# Patient Record
Sex: Female | Born: 1960 | Race: White | Hispanic: No | Smoking: Never smoker
Health system: Southern US, Community
[De-identification: ages and names within clinical notes are randomized; demographics above are authoritative.]

## PROBLEM LIST (undated history)

## (undated) DIAGNOSIS — E669 Obesity, unspecified: Secondary | ICD-10-CM

## (undated) DIAGNOSIS — K760 Fatty (change of) liver, not elsewhere classified: Secondary | ICD-10-CM

## (undated) DIAGNOSIS — R011 Cardiac murmur, unspecified: Secondary | ICD-10-CM

## (undated) DIAGNOSIS — K219 Gastro-esophageal reflux disease without esophagitis: Secondary | ICD-10-CM

## (undated) DIAGNOSIS — M1990 Unspecified osteoarthritis, unspecified site: Secondary | ICD-10-CM

## (undated) DIAGNOSIS — IMO0002 Reserved for concepts with insufficient information to code with codable children: Secondary | ICD-10-CM

## (undated) DIAGNOSIS — T7840XA Allergy, unspecified, initial encounter: Secondary | ICD-10-CM

## (undated) HISTORY — PX: CHOLECYSTECTOMY: SHX55

## (undated) HISTORY — DX: Gastro-esophageal reflux disease without esophagitis: K21.9

## (undated) HISTORY — DX: Fatty (change of) liver, not elsewhere classified: K76.0

## (undated) HISTORY — DX: Reserved for concepts with insufficient information to code with codable children: IMO0002

## (undated) HISTORY — PX: LUMBAR SPINE SURGERY: SHX701

## (undated) HISTORY — DX: Unspecified osteoarthritis, unspecified site: M19.90

## (undated) HISTORY — DX: Cardiac murmur, unspecified: R01.1

## (undated) HISTORY — PX: SPINE SURGERY: SHX786

## (undated) HISTORY — DX: Allergy, unspecified, initial encounter: T78.40XA

## (undated) HISTORY — DX: Obesity, unspecified: E66.9

---

## 1999-02-12 ENCOUNTER — Other Ambulatory Visit: Admission: RE | Admit: 1999-02-12 | Discharge: 1999-02-12 | Payer: Self-pay | Admitting: Gynecology

## 2000-06-23 ENCOUNTER — Other Ambulatory Visit: Admission: RE | Admit: 2000-06-23 | Discharge: 2000-06-23 | Payer: Self-pay | Admitting: Gynecology

## 2001-05-28 ENCOUNTER — Encounter: Payer: Self-pay | Admitting: Specialist

## 2001-05-28 ENCOUNTER — Encounter: Admission: RE | Admit: 2001-05-28 | Discharge: 2001-05-28 | Payer: Self-pay | Admitting: Specialist

## 2001-06-22 ENCOUNTER — Observation Stay (HOSPITAL_COMMUNITY): Admission: RE | Admit: 2001-06-22 | Discharge: 2001-06-23 | Payer: Self-pay | Admitting: Specialist

## 2001-06-22 ENCOUNTER — Encounter (INDEPENDENT_AMBULATORY_CARE_PROVIDER_SITE_OTHER): Payer: Self-pay

## 2001-06-22 ENCOUNTER — Encounter: Payer: Self-pay | Admitting: Specialist

## 2001-08-03 ENCOUNTER — Other Ambulatory Visit: Admission: RE | Admit: 2001-08-03 | Discharge: 2001-08-03 | Payer: Self-pay | Admitting: Internal Medicine

## 2002-08-30 ENCOUNTER — Other Ambulatory Visit: Admission: RE | Admit: 2002-08-30 | Discharge: 2002-08-30 | Payer: Self-pay | Admitting: Internal Medicine

## 2003-05-24 ENCOUNTER — Encounter: Payer: Self-pay | Admitting: Internal Medicine

## 2003-05-24 ENCOUNTER — Encounter: Admission: RE | Admit: 2003-05-24 | Discharge: 2003-05-24 | Payer: Self-pay | Admitting: Internal Medicine

## 2004-08-07 ENCOUNTER — Ambulatory Visit: Payer: Self-pay | Admitting: Internal Medicine

## 2004-08-19 ENCOUNTER — Other Ambulatory Visit: Admission: RE | Admit: 2004-08-19 | Discharge: 2004-08-19 | Payer: Self-pay | Admitting: Internal Medicine

## 2004-08-19 ENCOUNTER — Ambulatory Visit: Payer: Self-pay | Admitting: Internal Medicine

## 2005-01-01 ENCOUNTER — Ambulatory Visit: Payer: Self-pay | Admitting: Family Medicine

## 2005-08-29 ENCOUNTER — Ambulatory Visit: Payer: Self-pay | Admitting: Family Medicine

## 2005-09-08 ENCOUNTER — Encounter: Payer: Self-pay | Admitting: Family Medicine

## 2005-09-08 LAB — CONVERTED CEMR LAB

## 2006-01-01 ENCOUNTER — Other Ambulatory Visit: Admission: RE | Admit: 2006-01-01 | Discharge: 2006-01-01 | Payer: Self-pay | Admitting: Gynecology

## 2006-02-12 ENCOUNTER — Ambulatory Visit: Payer: Self-pay | Admitting: Family Medicine

## 2006-02-17 ENCOUNTER — Ambulatory Visit: Payer: Self-pay | Admitting: Family Medicine

## 2006-05-04 ENCOUNTER — Encounter: Admission: RE | Admit: 2006-05-04 | Discharge: 2006-05-04 | Payer: Self-pay | Admitting: Gynecology

## 2006-09-04 ENCOUNTER — Ambulatory Visit: Payer: Self-pay | Admitting: Family Medicine

## 2006-09-07 ENCOUNTER — Ambulatory Visit: Payer: Self-pay | Admitting: Family Medicine

## 2007-02-07 LAB — CONVERTED CEMR LAB: Pap Smear: NORMAL

## 2007-04-23 ENCOUNTER — Other Ambulatory Visit: Admission: RE | Admit: 2007-04-23 | Discharge: 2007-04-23 | Payer: Self-pay | Admitting: Gynecology

## 2007-09-14 ENCOUNTER — Encounter: Payer: Self-pay | Admitting: Family Medicine

## 2007-09-14 DIAGNOSIS — E785 Hyperlipidemia, unspecified: Secondary | ICD-10-CM

## 2007-09-14 DIAGNOSIS — IMO0002 Reserved for concepts with insufficient information to code with codable children: Secondary | ICD-10-CM

## 2007-09-14 DIAGNOSIS — M1990 Unspecified osteoarthritis, unspecified site: Secondary | ICD-10-CM | POA: Insufficient documentation

## 2007-09-14 DIAGNOSIS — J45909 Unspecified asthma, uncomplicated: Secondary | ICD-10-CM | POA: Insufficient documentation

## 2007-09-14 DIAGNOSIS — K219 Gastro-esophageal reflux disease without esophagitis: Secondary | ICD-10-CM

## 2007-09-15 ENCOUNTER — Ambulatory Visit: Payer: Self-pay | Admitting: Family Medicine

## 2007-09-16 LAB — CONVERTED CEMR LAB
ALT: 14 units/L (ref 0–35)
Alkaline Phosphatase: 59 units/L (ref 39–117)
BUN: 7 mg/dL (ref 6–23)
Basophils Absolute: 0 10*3/uL (ref 0.0–0.1)
Basophils Relative: 0.4 % (ref 0.0–1.0)
Bilirubin, Direct: 0.1 mg/dL (ref 0.0–0.3)
CO2: 28 meq/L (ref 19–32)
Calcium: 8.7 mg/dL (ref 8.4–10.5)
Cholesterol: 217 mg/dL (ref 0–200)
Eosinophils Absolute: 0.1 10*3/uL (ref 0.0–0.6)
GFR calc Af Amer: 87 mL/min
GFR calc non Af Amer: 72 mL/min
HDL: 42.8 mg/dL (ref >39.0)
Hemoglobin: 13.6 g/dL (ref 12.0–15.0)
Lymphocytes Relative: 30.6 % (ref 12.0–46.0)
MCHC: 34.8 g/dL (ref 30.0–36.0)
MCV: 92.5 fL (ref 78.0–100.0)
Monocytes Absolute: 0.3 10*3/uL (ref 0.2–0.7)
Monocytes Relative: 6 % (ref 3.0–11.0)
Neutro Abs: 3.6 10*3/uL (ref 1.4–7.7)
Platelets: 306 10*3/uL (ref 150–400)
Potassium: 3.8 meq/L (ref 3.5–5.1)
TSH: 1.32 microintl units/mL (ref 0.35–5.50)
Total Protein: 6.8 g/dL (ref 6.0–8.3)
VLDL: 45 mg/dL — ABNORMAL HIGH (ref 0–40)

## 2008-03-18 ENCOUNTER — Encounter: Payer: Self-pay | Admitting: Family Medicine

## 2008-03-18 ENCOUNTER — Observation Stay (HOSPITAL_COMMUNITY): Admission: EM | Admit: 2008-03-18 | Discharge: 2008-03-19 | Payer: Self-pay | Admitting: Emergency Medicine

## 2008-03-18 ENCOUNTER — Encounter (INDEPENDENT_AMBULATORY_CARE_PROVIDER_SITE_OTHER): Payer: Self-pay | Admitting: Surgery

## 2008-04-08 LAB — CONVERTED CEMR LAB: Pap Smear: NORMAL

## 2008-04-11 ENCOUNTER — Encounter: Admission: RE | Admit: 2008-04-11 | Discharge: 2008-04-11 | Payer: Self-pay | Admitting: Gynecology

## 2008-06-02 ENCOUNTER — Ambulatory Visit: Payer: Self-pay | Admitting: Women's Health

## 2008-06-02 ENCOUNTER — Encounter: Payer: Self-pay | Admitting: Women's Health

## 2008-06-02 ENCOUNTER — Other Ambulatory Visit: Admission: RE | Admit: 2008-06-02 | Discharge: 2008-06-02 | Payer: Self-pay | Admitting: Gynecology

## 2008-09-15 ENCOUNTER — Ambulatory Visit: Payer: Self-pay | Admitting: Family Medicine

## 2008-09-19 LAB — CONVERTED CEMR LAB
Albumin: 3.8 g/dL (ref 3.5–5.2)
BUN: 7 mg/dL (ref 6–23)
Basophils Absolute: 0.1 10*3/uL (ref 0.0–0.1)
Basophils Relative: 0.9 % (ref 0.0–3.0)
Calcium: 8.9 mg/dL (ref 8.4–10.5)
Cholesterol: 198 mg/dL (ref 0–200)
Creatinine, Ser: 0.7 mg/dL (ref 0.4–1.2)
Eosinophils Absolute: 0.2 10*3/uL (ref 0.0–0.7)
Eosinophils Relative: 2.1 % (ref 0.0–5.0)
GFR calc Af Amer: 116 mL/min
GFR calc non Af Amer: 96 mL/min
HCT: 38.4 % (ref 36.0–46.0)
HDL: 47.3 mg/dL (ref >39.0)
MCHC: 34.8 g/dL (ref 30.0–36.0)
MCV: 94.9 fL (ref 78.0–100.0)
Monocytes Absolute: 0.5 10*3/uL (ref 0.1–1.0)
RBC: 4.05 M/uL (ref 3.87–5.11)
TSH: 0.97 microintl units/mL (ref 0.35–5.50)
WBC: 9.4 10*3/uL (ref 4.5–10.5)

## 2009-06-06 ENCOUNTER — Encounter: Admission: RE | Admit: 2009-06-06 | Discharge: 2009-06-06 | Payer: Self-pay | Admitting: Gynecology

## 2009-09-11 ENCOUNTER — Ambulatory Visit: Payer: Self-pay | Admitting: Family Medicine

## 2009-09-11 LAB — CONVERTED CEMR LAB
ALT: 14 units/L (ref 0–35)
Alkaline Phosphatase: 50 units/L (ref 39–117)
Basophils Relative: 0.5 % (ref 0.0–3.0)
Bilirubin, Direct: 0.1 mg/dL (ref 0.0–0.3)
Chloride: 103 meq/L (ref 96–112)
Cholesterol: 208 mg/dL — ABNORMAL HIGH (ref 0–200)
Creatinine, Ser: 0.9 mg/dL (ref 0.4–1.2)
Direct LDL: 130.8 mg/dL
Eosinophils Relative: 1.2 % (ref 0.0–5.0)
HCT: 41.1 % (ref 36.0–46.0)
Hemoglobin: 13.7 g/dL (ref 12.0–15.0)
MCV: 96.1 fL (ref 78.0–100.0)
Monocytes Absolute: 0.3 10*3/uL (ref 0.1–1.0)
Neutrophils Relative %: 64.4 % (ref 43.0–77.0)
Potassium: 4.2 meq/L (ref 3.5–5.1)
RBC: 4.27 M/uL (ref 3.87–5.11)
Sodium: 138 meq/L (ref 135–145)
Total CHOL/HDL Ratio: 5
Total Protein: 7 g/dL (ref 6.0–8.3)
WBC: 6.1 10*3/uL (ref 4.5–10.5)

## 2009-09-17 ENCOUNTER — Ambulatory Visit: Payer: Self-pay | Admitting: Family Medicine

## 2010-05-09 LAB — HM MAMMOGRAPHY: HM Mammogram: NORMAL

## 2010-07-10 ENCOUNTER — Encounter: Admission: RE | Admit: 2010-07-10 | Discharge: 2010-07-10 | Payer: Self-pay | Admitting: Gynecology

## 2010-10-08 NOTE — Assessment & Plan Note (Signed)
Summary: CPX  CYD   Vital Signs:  Patient profile:   50 year old female Height:      58.5 inches Weight:      241 pounds BMI:     49.69 Temp:     98.5 degrees F oral Pulse rate:   72 / minute Pulse rhythm:   regular BP sitting:   112 / 80  (left arm) Cuff size:   large  Vitals Entered By: Lowella Petties CMA (September 17, 2009 2:17 PM) CC: 30 minute check up   History of Present Illness: here for health mt exam  has been feeling ok - kind of tired working some long hours   wt is down 5 lb this visit-- has been really working on it  by her scales -- lost 6 lb has cut back on fat and portions in general - since her gallbladder surgery   recent labs all nl wellness panel but chol is up a bit  trig 186/ HDl 44 and LDL 130.8 (up from 114 last time)    liver tests nl (hx of fatty liver)  some walking for exercise  limited in what she can do with her back  is wearing toning shoes   pap 8/09 nl -- has f/u upcoming with gyn  period has been irregular -- closer to menopause has always been heavy and painful   mam 8/09 nl --then had one in 2010--thinks around sept -- wasnl  self exam no lumps or changes   Td up to date 2010 ptx was 07 flu shot -- did not get this year   nexium -- has worked well in general - occas breakthrough symptoms / not often at all  is wanting to try it just as needed --  also better with low fat diet   Allergies: 1)  Codeine  Past History:  Past Surgical History: Last updated: 03/28/2008 Lumbar spine surgery- ruptured disk 09 [facility]  Family History: Last updated: 09/15/2008 Father: HTN, MI x 3 Mother: HTN,  chol, died from kidney disease, thyroid probs, female cancer?    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 Eyes:  Denies blurring and eye pain. ENT:  Denies sore throat. CV:  Denies chest pain or discomfort, palpitations, shortness of breath with exertion, and swelling of feet. Resp:  Denies cough and wheezing. GI:  Denies abdominal pain, bloody stools, change in bowel habits, indigestion, and nausea. GU:  Complains of abnormal vaginal bleeding; denies discharge, dysuria, and urinary frequency. MS:  Complains of low back pain and stiffness; denies joint pain and muscle weakness. Derm:  Denies changes in color of skin, poor wound healing, and rash. Neuro:  Denies numbness and tingling. Psych:  Denies anxiety and depression. Endo:  Denies cold intolerance, excessive thirst, excessive urination, and heat intolerance. Heme:  Denies abnormal bruising and bleeding.  Physical Exam  General:  morbidly obese and well appearing Head:  normocephalic, atraumatic, and no abnormalities observed.   Eyes:  vision grossly intact, pupils equal, pupils round, and pupils reactive to light.  no conjunctival pallor, injection or icterus  Ears:  R ear normal and L ear normal.  - scant cerumen Nose:  no nasal discharge.  Mouth:  pharynx pink and moist.   Neck:  supple with full rom and no masses or thyromegally, no JVD or carotid bruit  Lungs:  Normal respiratory effort, chest expands symmetrically. Lungs are clear to auscultation, no crackles or wheezes. Heart:  Normal rate and regular rhythm. S1 and S2 normal without gallop, murmur, click, rub or other extra sounds. Abdomen:  Bowel sounds positive,abdomen soft and non-tender without masses, organomegaly or hernias noted. no renal bruits  new laparoscopy scars are well healed Msk:  No deformity or scoliosis noted of thoracic or lumbar spine.  poor rom LS - baseline  no acute joint changes  Pulses:  R and L carotid,radial,femoral,dorsalis pedis and  posterior tibial pulses are full and equal bilaterally Extremities:  No clubbing, cyanosis, edema, or deformity noted with normal full range of motion of all joints.   Neurologic:  sensation intact to light touch, gait normal, and DTRs symmetrical and normal.   Skin:  Intact without suspicious lesions or rashes fair with some lentigos  Cervical Nodes:  No lymphadenopathy noted Inguinal Nodes:  No significant adenopathy Psych:  normal affect, talkative and pleasant    Impression & Recommendations:  Problem # 1:  HEALTH MAINTENANCE EXAM (ICD-V70.0) Assessment Comment Only reviewed health habits including diet, exercise and skin cancer prevention reviewed health maintenance list and family history reviewed labs today in detail disc imp of wt loss to general health  Problem # 2:  HYPERCHOLESTEROLEMIA, BORDERLINE (ICD-272.4) Assessment: Deteriorated  rev lipids today with LDL 130 would like it to be lower - goal below 130 and trig below 150 disc low sat fat diet - will continue to follow   Labs Reviewed: SGOT: 16 (09/11/2009)   SGPT: 14 (09/11/2009)   HDL:44.30 (09/11/2009), 47.3 (09/15/2008)  LDL:116 (09/15/2008), DEL (09/15/2007)  Chol:208 (09/11/2009), 198 (09/15/2008)  Trig:186.0 (09/11/2009), 172 (09/15/2008)  Problem # 3:  GERD (ICD-530.81) Assessment: Improved overall doing better with this -- enc further wt loss will try nexium as needed -- and update me if she has heartburn more than 2 times per week  disc diet - avoid caffiene and spicy foods  Her updated medication list for this problem includes:    Nexium 40 Mg Cpdr (Esomeprazole magnesium) .Marland Kitchen... 1 by mouth once daily in am  Complete Medication List: 1)  Vitamin C 1000 Mg Tabs (Ascorbic acid) .... One by mouth qd 2)  Nexium 40 Mg Cpdr (Esomeprazole magnesium) .Marland Kitchen.. 1 by mouth once daily in am 3)  Multi-vitamin Tabs (Multiple vitamin) .... One by mouth daily 4)  Vit E  .... One by mouth dialy 5)  Vitamin D 2000 Unit  Tabs (Cholecalciferol) .... One tab by mouth daily 6)  Fish Oil  .... Take by mouth daily as directed  Patient Instructions: 1)  if you want to try stopping nexium -- do so  2)  if you have reflux symptoms more than 2 times per week- go back on it daily, otherwise take it as needed  3)  keep working on healthy diet and exercise for weight loss  Prescriptions: NEXIUM 40 MG  CPDR (ESOMEPRAZOLE MAGNESIUM) 1 by mouth once daily in am  #30 x 11   Entered and Authorized by:   Judith Part MD   Signed by:   Judith Part MD on 09/17/2009   Method used:   Print then Give to Patient   RxID:   [id number]   Prior Medications (reviewed today): VITAMIN C 1000 MG  TABS (ASCORBIC ACID) one  by mouth qd MULTI-VITAMIN   TABS (MULTIPLE VITAMIN) one by mouth daily VIT E () one by mouth dialy VITAMIN D 2000 UNIT TABS (CHOLECALCIFEROL) one tab by mouth daily FISH OIL () take by mouth daily as directed Current Allergies: CODEINE   Preventive Care Screening  Mammogram:    Date:  06/08/2009    Results:  normal

## 2010-10-21 ENCOUNTER — Telehealth (INDEPENDENT_AMBULATORY_CARE_PROVIDER_SITE_OTHER): Payer: Self-pay | Admitting: *Deleted

## 2010-10-23 ENCOUNTER — Other Ambulatory Visit (INDEPENDENT_AMBULATORY_CARE_PROVIDER_SITE_OTHER): Payer: PRIVATE HEALTH INSURANCE

## 2010-10-23 ENCOUNTER — Other Ambulatory Visit: Payer: Self-pay | Admitting: Family Medicine

## 2010-10-23 ENCOUNTER — Encounter (INDEPENDENT_AMBULATORY_CARE_PROVIDER_SITE_OTHER): Payer: Self-pay | Admitting: *Deleted

## 2010-10-23 DIAGNOSIS — Z0000 Encounter for general adult medical examination without abnormal findings: Secondary | ICD-10-CM

## 2010-10-23 LAB — BASIC METABOLIC PANEL
BUN: 12 mg/dL (ref 6–23)
CO2: 29 mEq/L (ref 19–32)
Calcium: 8.9 mg/dL (ref 8.4–10.5)
Creatinine, Ser: 0.9 mg/dL (ref 0.4–1.2)
Glucose, Bld: 85 mg/dL (ref 70–99)

## 2010-10-23 LAB — HEPATIC FUNCTION PANEL
Albumin: 3.7 g/dL (ref 3.5–5.2)
Alkaline Phosphatase: 59 U/L (ref 39–117)
Bilirubin, Direct: 0.1 mg/dL (ref 0.0–0.3)

## 2010-10-23 LAB — LIPID PANEL
HDL: 51.9 mg/dL (ref >39.00)
Triglycerides: 165 mg/dL — ABNORMAL HIGH (ref 0.0–149.0)

## 2010-10-24 LAB — CBC WITH DIFFERENTIAL/PLATELET
Basophils Absolute: 0 10*3/uL (ref 0.0–0.1)
Eosinophils Absolute: 0.2 10*3/uL (ref 0.0–0.7)
Lymphocytes Relative: 26.1 % (ref 12.0–46.0)
MCHC: 34.1 g/dL (ref 30.0–36.0)
Neutrophils Relative %: 67.3 % (ref 43.0–77.0)
RDW: 13.4 % (ref 11.5–14.6)

## 2010-10-29 ENCOUNTER — Encounter: Payer: Self-pay | Admitting: Family Medicine

## 2010-10-29 ENCOUNTER — Encounter (INDEPENDENT_AMBULATORY_CARE_PROVIDER_SITE_OTHER): Payer: PRIVATE HEALTH INSURANCE | Admitting: Family Medicine

## 2010-10-29 DIAGNOSIS — K219 Gastro-esophageal reflux disease without esophagitis: Secondary | ICD-10-CM

## 2010-10-29 DIAGNOSIS — D72829 Elevated white blood cell count, unspecified: Secondary | ICD-10-CM | POA: Insufficient documentation

## 2010-10-29 DIAGNOSIS — E785 Hyperlipidemia, unspecified: Secondary | ICD-10-CM

## 2010-10-29 DIAGNOSIS — Z0000 Encounter for general adult medical examination without abnormal findings: Secondary | ICD-10-CM

## 2010-10-30 NOTE — Progress Notes (Signed)
----   Converted from flag ---- ---- 10/19/2010 6:26 PM, Colon Flattery Tower MD wrote: please check lipid and wellness v70.0 thanks  ---- 10/15/2010 8:35 AM, Liane Comber CMA (AAMA) wrote: Lab orders please! Good Morning! This pt is scheduled for cpx labs Wed, which labs to draw and dx codes to use? Thanks Tasha ------------------------------

## 2010-11-01 ENCOUNTER — Telehealth: Payer: Self-pay | Admitting: Family Medicine

## 2010-11-05 NOTE — Assessment & Plan Note (Signed)
Summary: CPX/LFW   Vital Signs:  Patient profile:   50 year old female Weight:      242 pounds BMI:     49.90 O2 Sat:      98 % on Room air Temp:     98.3 degrees F oral Pulse rate:   86 / minute Pulse rhythm:   regular BP sitting:   120 / 70  (left arm) Cuff size:   large  Vitals Entered By: Mervin Hack CMA Duncan Dull) (October 29, 2010 2:31 PM)  O2 Flow:  Room air CC: adult physical   History of Present Illness: here for health mt exam  wt is up 1 lb with bmi of 49 has been working on diet -- trying to loose but cannot do much with her back  some exercises and stretches  limited as to what she can do     for the most part feeling allright  back pain worse in dec -- saw Dr Jillyn Hidden and put in PT  happened vaccuming -- still now has to be careful in what she does   120/70 - good bp   last pap was 8/09 will be making appt for gyn soon  did get mammogram last year in sept   mam 09/11 nl  self breast exam - no lumps   TD 2010 flu shot- does not generally get  ptx was 07--has been 5 y- will wait another year  lipids - mildly high in past Last Lipid ProfileCholesterol: 192 (10/23/2010 2:17:54 PM)HDL:  51.90 (10/23/2010 2:17:54 PM)LDL:  107 (10/23/2010 2:17:54 PM)Triglycerides:  Last Liver profileSGOT:  18 (10/23/2010 2:17:54 PM)SPGT:  19 (10/23/2010 2:17:54 PM)T. Bili:  0.6 (10/23/2010 2:17:54 PM)Alk Phos:  59 (10/23/2010 2:17:54 PM)  improved this draw with LDL 107 has changed her diet quite a bit  is trying to control portion sizes   on nexium for gerd - still works for her  tried going off of it - symptoms got much worse   wbc is borderline high today-- no illness or symptoms but exposed to a lot  ? stress from back pain  under a lot of stress most days   getting close to menopause - periods are irregular     Allergies: 1)  Codeine  Past History:  Past Surgical History: Last updated: 03/28/2008 Lumbar spine surgery- ruptured disk 09 [facility]  Family  History: Last updated: 09/15/2008 Father: HTN, MI x 3 Mother: HTN,  chol, died from kidney disease, thyroid probs, female cancer?    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 Eyes:  Denies blurring and eye irritation. CV:  Denies chest pain or discomfort and lightheadness. Resp:  Denies cough, shortness of breath, and wheezing. GI:  Denies abdominal pain, indigestion, nausea, and vomiting. GU:  Complains of abnormal vaginal bleeding. MS:  Complains of low back pain. Derm:  Denies itching, lesion(s), poor wound healing, and rash. Neuro:  Denies numbness and tingling. Psych:  Denies anxiety and depression; has been very stressed . Endo:  Denies cold intolerance, excessive thirst, excessive urination, and heat intolerance. Heme:  Denies abnormal bruising and bleeding.  Physical Exam  General:  morbidly  obese and well appearing Head:  normocephalic and atraumatic.   Eyes:  vision grossly intact, pupils equal, pupils round, and pupils reactive to light.  no conjunctival pallor, injection or icterus  Ears:  R ear normal and L ear normal.   Nose:  no nasal discharge.  - nares are boggy Mouth:  pharynx pink and moist.   Neck:  supple with full rom and no masses or thyromegally, no JVD or carotid bruit  Chest Wall:  No deformities, masses, or tenderness noted. Lungs:  Normal respiratory effort, chest expands symmetrically. Lungs are clear to auscultation, no crackles or wheezes. Heart:  Normal rate and regular rhythm. S1 and S2 normal without gallop, murmur, click, rub or other extra sounds. Abdomen:  Bowel sounds  positive,abdomen soft and non-tender without masses, organomegaly or hernias noted. no renal bruits   Msk:  No deformity or scoliosis noted of thoracic or lumbar spine.  poor rom LS - baseline  no acute joint changes  Pulses:  R and L carotid,radial,femoral,dorsalis pedis and posterior tibial pulses are full and equal bilaterally Extremities:  No clubbing, cyanosis, edema, or deformity noted with normal full range of motion of all joints.   Neurologic:  sensation intact to light touch, gait normal, and DTRs symmetrical and normal.   Skin:  Intact without suspicious lesions or rashes baseline flesh colored nevi on face without change Cervical Nodes:  No lymphadenopathy noted Inguinal Nodes:  No significant adenopathy Psych:  normal affect, talkative and pleasant    Impression & Recommendations:  Problem # 1:  HEALTH MAINTENANCE EXAM (ICD-V70.0) Assessment Comment Only reviewed health habits including diet, exercise and skin cancer prevention reviewed health maintenance list and family history wellness labs reviewed with pt  reviewed strategies for weight loss  pt will f/u with her gyn for annual exam and 3 year pap  Problem # 2:  HYPERCHOLESTEROLEMIA, BORDERLINE (ICD-272.4) Assessment: Improved  this has improved substantially with low sat fat diet and some omega 3 supplement  rev labs with pt and commended  will try to keep it up   Labs Reviewed: SGOT: 18 (10/23/2010)   SGPT: 19 (10/23/2010)   HDL:51.90 (10/23/2010), 44.30 (09/11/2009)  LDL:107 (10/23/2010), 116 (09/15/2008)  Chol:192 (10/23/2010), 208 (09/11/2009)  Trig:165.0 (10/23/2010), 186.0 (09/11/2009)  Problem # 3:  GERD (ICD-530.81) Assessment: Unchanged  good control with nexium- plans to continue this  pt aware inc risk of OP and will watch this post menopause disc rec for ca and vit D  Her updated medication list for this problem includes:    Nexium 40 Mg Cpdr (Esomeprazole magnesium) .Marland Kitchen... 1 by mouth once daily  in am  Orders: Prescription Created Electronically [phone])  Problem # 4:  LEUKOCYTOSIS (ICD-288.60) Assessment: New this is relatively mild - ? stress related re check 2-3 mo and update if any symptoms of infection at all- esp fever   Complete Medication List: 1)  Nexium 40 Mg Cpdr (Esomeprazole magnesium) .Marland Kitchen.. 1 by mouth once daily in am 2)  Vitamin C 1000 Mg Tabs (Ascorbic acid) .... One by mouth once daily 3)  Multi-vitamin Tabs (Multiple vitamin) .... One by mouth daily 4)  Mobic 7.5 Mg Tabs (Meloxicam) .... Take 1 by mouth once daily 5)  Ocuvite-lutein Caps (Multiple vitamins-minerals) .... Take 1 by mouth once daily 6)  Mega Basic Tabs (Multiple vitamins-minerals) .... Take 1 by mouth once daily 5000iu  Patient Instructions: 1)  the current recommendation for calcium intake is 1200-1500 mg daily with-2000 IU of vitamin D  2)  if you are interested - weight watchers is a good program for weight loss  3)  no change in medicines  4)  update me if you get a fever or other symptoms because of white blood cell count 5)  re check labs 2-3 months for cbc with diff for leukocytosis non fasting  Prescriptions: NEXIUM 40 MG  CPDR (ESOMEPRAZOLE MAGNESIUM) 1 by mouth once daily in am  #90 x 3   Entered by:   Mervin Hack CMA (AAMA)   Authorized by:   Judith Part MD   Signed by:   Mervin Hack CMA (AAMA) on 10/29/2010   Method used:   Electronically to        [facility] [id number] (retail)       [address]       [place], [place]  [address]       Ph: [phone] or [phone]       Fax: [phone]   RxID:   [id number]    Orders Added: 1)  Est. Patient 40-64 years [[id number]] 2)  Prescription Created Electronically [phone]    Current Allergies (reviewed today): CODEINE   Preventive Care Screening  Mammogram:    Date:  05/09/2010    Results:  normal

## 2010-11-05 NOTE — Progress Notes (Signed)
  Phone Note Call from Patient   Summary of Call: got note from pt's husband who was here for a visit --- she said a px quantity was called in wrong -- but I don't understand the note   Follow-up for Phone Call        could you please call her to clarify-? thanks (and cross check with her med list) Follow-up by: Judith Part MD,  November 01, 2010 11:37 AM  Additional Follow-up for Phone Call Additional follow up Details #1::        Patient notified as instructed by telephone. Pt said when picked up rx #30 with 3 refills. Spoke with pharmacist at [facility] and he said insurance will only pay for #30 and pt does have 11 additional refills.Lewanda Rife LPN  November 01, 2010 12:49 PM

## 2011-01-21 NOTE — H&P (Signed)
NAMEMARGARUITE, TOP NO.:  [id number]   MEDICAL RECORD NO.:  [id number]          PATIENT TYPE:  EMS   LOCATION:  ED                           FACILITY:  [facility]   PHYSICIAN:  Ardeth Sportsman, MD     DATE OF BIRTH:  01/02/1961   DATE OF ADMISSION:  03/18/2008  DATE OF DISCHARGE:                              HISTORY & PHYSICAL   PRIMARY CARE PHYSICIAN:  Dr. Milinda Antis at [facility]   REQUESTING PHYSICIAN:  Dr. Randa Evens with [facility] Emergency Department.   SURGEON:  Ardeth Sportsman, MD.   REASON FOR CONSULTATION AND ADMISSION:  Probable cholecystitis.   HISTORY OF PRESENT ILLNESS:  Raven Gibson is a 50 year old morbidly obese  female who has had intermittent mild abdominal pain after eating.  She  has a history of gastroesophageal reflux disease as well controlled with  Nexium.   Last night she woke up in the middle of the night with severe diffuse  abdominal pain.  It became more focal in the right upper quadrant.  It  radiated to her back.  It did not go to her shoulder.  She tried getting  up and walking around, moving and twisting.  She tried a little bit of  water.  She tried some Pepto-Bismol.  Nothing seemed to work and the  pain got more intense.  Her husband tried to convince her to come to the  emergency department but she was afraid to go, however, her pain got  worse and therefore she acquiesced and she was brought to the emergency  department.  She received an IV narcotic and nausea medications and  seemed to have some improvement but the pain keeps recurring.  She has  had a workup that is suspicious for cholecystis or at least the presence  of severely symptomatic gallstones.   Dr. Randa Evens has been trying to stabilize the patient but she still gets  severe nausea and pain when she tries to even sip a sip of water.  Based  on concerns, surgical consultation was requested for a possibility of  cholecystitis.   The patient normally has a bowel  movement every day with no bouts of  constipation or diarrhea recently.  She has constipation in the distant  past but it seems to have resolved.  She has never had any abdominal  surgeries.  She does get severe pain with menstrual periods but her last  menstrual period was 2 weeks ago and does not seem consistent with that.  She had colonoscopy over 20 years ago by [facility] for abdominal pain that  was completely negative.  She has never had an EGD.  No history of  jaundice or hepatitis.  No sick contacts or travel history.   PAST MEDICAL HISTORY:  1. Gastroesophageal reflux disease, well controlled on oral Nexium.  2. Asthma.  3. Morbid obesity.   PAST SURGICAL HISTORY:  She has had back surgery.  She has never had any  abdominal surgeries.   SOCIAL HISTORY:  No tobacco, alcohol or drug use.  She is here in the ED  with her husband.   FAMILY HISTORY:  Noncontributory for any hepatobiliary or pancreatic  disorders or any major GI or inflammatory bowel disease that she can  recall.   ALLERGIES:  SHE DOES NOT TOLERATE CODEINE.   MEDICATIONS:  She takes Nexium daily.   REVIEW OF SYSTEMS:  As per HPI.  CONSTITUTIONAL:  No fevers, chills,  sweats, or change in her weight.  HEENT:  Otherwise negative.  CARDIAC:  Otherwise negative.  PULMONARY:  Otherwise negative.  ABDOMEN:  She does have some epigastric soreness but it is not painful.  Otherwise negative.  No hematochezia or melena.  CHEST:  Some right-  sided chest pain.  PSYCH/NEURO:  Negative.  MUSCULOSKELETAL:  She has  some chronic lower lumbar pain but it is rather mild and has not changed  recently.  GU:  Negative.  GYN:  As per HPI, otherwise negative.  LYMPH:  Negative.  BREASTS:  Negative.   PHYSICAL EXAMINATION:  VITAL SIGNS:  Her temperature is 97.7, blood  pressure 137/66, pulse 61, respirations 20.  Her pain was a 10/10 and  then it went to 2/10 after getting pain medication.  It keeps going back  up to around 6 or  7 without them.  She has 100% sats on room air.  She is a well-developed, well-nourished morbidly obese female lying in  bed very still with a washcloth on her head, wanting the lights down,  obviously uncomfortable.  PSYCH:  She is of at least average intelligence and pretty good insight.  No dementia, delirium, psychosis.  NEUROLOGIC:  Cranial nerves 2-12 are intact.  Hand grip is 5/5, equal  and symmetrical.  No resting or intention tremors.  HEENT:  Pupils are equal, round and react to light.  Extraocular  movements are intact.  Nasopharynx and oropharynx are clear.  Normocephalic.  Mucous membranes are dry.  No step-off or other  abnormalities.  NECK:  Supple without any masses.  Trachea is midline.  CHEST:  Clear to auscultation bilaterally anteriorly and posteriorly.  No wheezes, rales or rhonchi.  No significant pain to rib or sternal  compression.  HEART:  Regular rate and rhythm.  No murmurs, gallops or rubs.  ABDOMEN:  Morbidly obese but soft.  She does have some mild-to-moderate  discomfort in the right upper quadrant but no classic Murphy's sign.  She has no definite umbilical hernia although I felt a slight fullness  at her stalk.  GENITALS:  Normal external female genitalia with no inguinal hernias.  RECTAL:  Deferred at the patient's request.  BREASTS:  Deferred at the patient's request.  LYMPH:  No head and neck, axillary, inguinal lymphadenopathy.  EXTREMITIES:  No clubbing, cyanosis or edema.  MUSCULOSKELETAL:  Full range of motion in shoulders, elbows, wrists &  hips,knees or ankles.  No significant pain on the cervical,  thoracolumbar or sacral spine.   LABORATORY VALUES:  Her white count is 12.4 with a slight left shift.  Her electrolytes appear to be normal.  Urinalysis shows positive  ketones.  Urine pregnancy is negative.  Ultrasound shows positive  gallstones, the largest one being 1.5 cm in size.  There is no major  gallbladder wall thickening or  pericholecystic fluid.   ASSESSMENT AND PLAN:  A morbidly obese 50 year old female with classic  story of biliary colic persisting with leukocytosis and persistent pain  concerning for cholecystitis.   The anatomy and physiology of hepatobiliary and pancreatic function was  discussed.  Differential diagnosis was discussed.  The natural history  of systemic gallstones with risks of cholecystis were discussed as well.  Options were discussed and recommendation was made for:  1. Admission.  2. Intravenous antibiotics.  3. Laparoscopic cholecystectomy with intraoperative cholangiogram.      The risks, benefits, and alternatives were discussed in detail.      Questions answered, and she and her husband agreed to proceed.  4. Also put her back on her Nexium for gastroesophageal reflux disease      and follow expectantly.  5. EKG preoperatively given some chest pain and given her morbid      obesity and age over 30.      Ardeth Sportsman, MD  Electronically Signed     SCG/MEDQ  D:  03/18/2008  T:  03/18/2008  Job:  [id number]

## 2011-01-21 NOTE — Op Note (Signed)
Raven Gibson, KAPLER NO.:  [id number]   MEDICAL RECORD NO.:  [id number]          PATIENT TYPE:  INP   LOCATION:  0102                         FACILITY:  [facility]   PHYSICIAN:  Ardeth Sportsman, MD     DATE OF BIRTH:  November 07, 1960   DATE OF PROCEDURE:  03/18/2008  DATE OF DISCHARGE:                               OPERATIVE REPORT   PREOPERATIVE DIAGNOSIS:  Acute cholecystitis.   POSTOPERATIVE DIAGNOSES:  1. Acute cholecystitis.  2. Steatohepatosis.   OPERATION PERFORMED:  Laparoscopic cholecystectomy without  cholangiogram.   SURGEON:  Ardeth Sportsman, M.D.   ASSISTANT:  None.   SPECIMEN:  Gallbladder.   DRAINS:  None.   ESTIMATED BLOOD LOSS:  The estimated blood loss was 50 mL.   COMPLICATIONS:  No major complications.   ANESTHESIA:  1. Local anesthesia in a field block around all port sites.  2. General anesthesia.   INDICATIONS:  The patient is a 50 year old morbidly obese female with a  classic story of biliary colic that has persisted for over 12 hours with  an elevated white count.  Concern was for acute cholecystitis.  Anatomy  and physiology of the hepatobiliary and pancreatic function were  discussed.  Pathophysiology of cholecystitis with its natural history of  risks were discussed.  Options were discussed and  recommendations were  made for a laparoscopic cholecystectomy.  Risks, benefits and  alternatives were discussed.  Questions were answered and she agreed to  proceed.   OPERATIVE FINDINGS:  The patient had an extremely dilated, but small  gallbladder with some gallbladder wall thickening and fibrosis.  She had  a lot of infundibular inflammation.  Her cystic duct was short and very  stenotic.  I was not able to pass a cholangiocatheter safely into it.  I  was able to get good critical views, however.  She did have a fatty  liver as well.   DESCRIPTION OF THE OPERATION:  Informed consent was confirmed.  The  patient was already on IV  Unasyn since I saw her in the ER.  She  underwent general anesthesia without any difficulty.  She had voided  just prior going to the operating room.  She had sequential compression  devices activated throughout the entire case.  She was positioned supine  with both arms tucked.   A 5-mm port was placed in the upper quadrant using optimal entry  technique with the patient in steep reversed Trendelenburg and the right  side up.  Camera inspection revealed no intra-abdominal injury.  Under  direct visualization the 5-mm ports were placed a right flank and  through the umbilicus.  A 10-mm port was tunneled through the falciform  ligament in the subxiphoid region.  Camera inspection revealed the  findings as noted above.   I could not grasp the gallbladder and so therefore it was decompressed  with a needle aspirator.  Some adhesions to the mesocolon and duodenum  were freed off using careful blunt and cold scissors dissection.  I was  gradually able to elevate the shrunken gallbladder anteriorly.  She had  a very large fatty liver and fatty mesorectum so the view space was  challenging initially.  Therefore I freed the peritoneal coverings on  the anteromedial and posterolateral aspects of the gallbladder.  I freed  the proximal two-thirds of the gallbladder off the liver bed so I could  get a better critical view.  Her infundibulum was rather socked in and  shrunken in from an obvious large gallstone.  Eventually over careful  time I was able free up the infundibulum and I was able to straighten  out the gallbladder and provide more proper hockey stick-like tension at  the infundibulum.  Careful dissection was done and a tubular structure  going on the anteromedial wall of the gallbladder consistent with the  main branch of the cystic artery could easily be seen.  With one clip on  the anterior branch and two clips slightly proximally it was transected.  A smaller posterior branch was  easily skeletonized and controlled with  cautery.  Further meticulous dissection was done until only one  structure was leaving the infundibulum going down to the porta hepatis  consistent with the cystic duct.   Partial cystic ductotomy was performed at the cystic duct just slightly  proximal to the infundibulum after a clip had been placed in the  infundibulum.  The cystic duct was about 3 cm long and in lifting up I  was obviously tenting up a narrow common bile duct.  I tried to avoid  overskeletonization at the common bile duct cystic duct junction.  It  was not able to milk back any stones.  Numerous attempts were made to  get a cholangiogram with a 5-French cholangiocatheter into the cystic  duct through a right subcostal stab incision, but the catheter would not  advance more than 7mm with a leak and I could not get a good seal on.  Because her liver function tests were not massively elevated, I decided  to abort versus trying to do it closer to the common bile duct and risk  a mild cystic duct stump leak.  Therefore four clips were placed close  together on the cystic duct and cystic duct transection was done at  infundibulum.  Because this was an inflamed case I went ahead and placed  a 0-Vicryl Endoloop around the cystic duct stump, and tied it down.   The gallbladder was freed from its remaining attachment to the liver bed  and brought inside the EndoCatch bag, and out the subxiphoid port site.  The resulting wound was too small to allow my pinky to pass, so I did  not do any more aggressive closure.   Meticulous hemostasis assured on the liver bed.  There is a small  scratch on the inferior part of the liver, but that was easily  controlled cautery as well.  Camera inspection revealed intact clips on  the liver and there no evidence of any bile leak or bleeding.  Copious  irrigation  with 2 liters was done with clear return at the end.  Catheter apparently was evacuated  from the ports were removed.  Skin was  closed using 4-0 Monocryl stitch.  Sterile dressing was applied.   The patient was extubated and was taken to the recovery room in stable  condition.   I discussed the postoperative care with the patient just prior to  surgery and her husband.  I am about to confer her with husband after  surgery.      Salvatore Decent  Gross, MD  Electronically Signed     SCG/MEDQ  D:  03/18/2008  T:  03/19/2008  Job:  [id number]   cc:   Marne A. Tower, MD  [address] [place], [place] [address]

## 2011-01-23 ENCOUNTER — Other Ambulatory Visit: Payer: Self-pay | Admitting: Family Medicine

## 2011-01-23 DIAGNOSIS — D72829 Elevated white blood cell count, unspecified: Secondary | ICD-10-CM

## 2011-01-24 NOTE — Op Note (Signed)
[facility]  Patient:    Raven Gibson, Raven Gibson Visit Number: [id number] MRN: [id number]          Service Type: SUR Location: [id number] Attending Physician:  Pierce Crane Proc. 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  POSTOPERATIVE DIAGNOSES:  Herniated nucleus pulposus, foraminal stenosis, L5-S1 on the right.  PROCEDURE PERFORMED:  Microdiskectomy L5-S1 on the right.  Foraminotomy L5, right.  Lateral recess decompression.  SURGEON:  Javier Docker, M.D.  ANESTHESIA:  General.  ASSISTANT:  Dorie Rank, P.A.  BRIEF HISTORY AND INDICATIONS:  A 50 year old with refractory right lower extremity radicular pain, HNP by MRI and myelogram, also narrowing of the neuroforamen.  Operative intervention was indicated for decompression of the S1 root and evaluation of the L5 foramen.  The risks and benefits discussed including bleeding, infection, damage to vascular structures, CSF leak, epidural fibrosis, need for fusion in the future, no changes in her back problems, etc.  DESCRIPTION OF PROCEDURE:  The patient is in supine position.  After the induction of adequate general anesthesia, 1 g of Kefzol, the patient placed prone on the [place] frame.  All bony prominences well padded.  Lumbar region is prepped and draped in the usual sterile fashion.  Two 18 gauge spinal needles utilized to localize the 5-2 interspace confirmed with x-ray, and incision was made then from the spinous process of L5-S1.  Subcutaneous tissue was dissected.  Electrocautery was utilized to achieve hemostasis.  Fascia lata identified and divided in line with the skin incision.  Paraspinous muscle was elevated from the lamina of L5 and S1.  McCullough retractor was placed.  A Penfield 4 placed in the interlaminar space confirmed with  x-ray. The ligamentum flavum removed from the interspace and a hemilaminotomy performed of S1 and L5 utilizing 2 and 3 mm Kerrison.  The S1 nerve root was identified and lateral recess gently mobilized medially.  There was compression noted here in the lateral recess to the ligamentum flavum, hypertrophy, and facet hypertrophy.  A foraminotomy of L5 was performed with a 2 mm Kerrison after a foraminotomy of S1 was performed.  The synovium and ligamentum flavum infolding and buckling and facet hypertroph to also produce stenosis of the L5 foramen in concert with the disk herniation.  The disk herniation was noted to extend out laterally.  The bipolar electrocautery was utilized to achieve hemostasis with the neural elements well-protected. Annulotomy was performed, and the extruded disk fragment was retrieved. Multiple fragments were removed from the disk space and out laterally. Further retrieval with the nerve hook.  The 5 and 6 S1 nerve roots were protected at all times.  Following this, hockey-stick probe placed in the foramen of 5 and S1 and found to be widely patent without residual compression.  There was free mobility of the 5 and S1 nerve roots following decompression.  No evidence of active bleeding or CSF leakage.  Disk space was copiously irrigated with antibiotic irrigation as was the interlaminar space. Thrombin-soaked Gelfoam was placed in the laminotomy defect, and the operating microscope, which had been draped, brought onto the surgical field was removed.  The paraspinous muscles inspected with no evidence of active bleeding.  The dorsolumbar fascia reapproximated with #1 Vicryl interrupted figure-of-eight  sutures, subcutaneous tissue reapproximated with 2-0 Vicryl simple sutures, skin reapproximated with staples.  The wound was dressed sterilely.  The patient was placed on the hospital bed, extubated without difficulty and transported to the recovery room in satisfactory  condition.  The patient tolerated the procedure well with no apparent complications. Attending Physician:  Pierce Crane DD:  06/22/01 TD:  06/22/01 Job: [id number]

## 2011-01-27 ENCOUNTER — Other Ambulatory Visit (INDEPENDENT_AMBULATORY_CARE_PROVIDER_SITE_OTHER): Payer: PRIVATE HEALTH INSURANCE

## 2011-01-27 DIAGNOSIS — D72829 Elevated white blood cell count, unspecified: Secondary | ICD-10-CM

## 2011-01-28 LAB — CBC WITH DIFFERENTIAL/PLATELET
Basophils Relative: 0.9 % (ref 0.0–3.0)
Eosinophils Absolute: 0.1 10*3/uL (ref 0.0–0.7)
Eosinophils Relative: 1.6 % (ref 0.0–5.0)
Hemoglobin: 13.1 g/dL (ref 12.0–15.0)
Lymphocytes Relative: 28.5 % (ref 12.0–46.0)
MCHC: 34.2 g/dL (ref 30.0–36.0)
MCV: 95.1 fl (ref 78.0–100.0)
Neutro Abs: 5.1 10*3/uL (ref 1.4–7.7)
RBC: 4.03 Mil/uL (ref 3.87–5.11)
WBC: 7.9 10*3/uL (ref 4.5–10.5)

## 2011-01-31 ENCOUNTER — Encounter: Payer: Self-pay | Admitting: *Deleted

## 2011-06-05 LAB — URINALYSIS, ROUTINE W REFLEX MICROSCOPIC
Nitrite: NEGATIVE
Protein, ur: NEGATIVE
Specific Gravity, Urine: 1.027
Urobilinogen, UA: 0.2

## 2011-06-05 LAB — LIPASE, BLOOD: Lipase: 20

## 2011-06-05 LAB — CBC
HCT: 35.4 — ABNORMAL LOW
HCT: 40.3
MCHC: 33.7
MCHC: 34.3
MCV: 93.2
MCV: 93.4
RBC: 3.79 — ABNORMAL LOW
RBC: 4.33
WBC: 17 — ABNORMAL HIGH

## 2011-06-05 LAB — IRON AND TIBC
Iron: 14 — ABNORMAL LOW
Saturation Ratios: 6 — ABNORMAL LOW
TIBC: 240 — ABNORMAL LOW

## 2011-06-05 LAB — COMPREHENSIVE METABOLIC PANEL
BUN: 10
CO2: 22
Calcium: 9.1
Creatinine, Ser: 0.83
GFR calc Af Amer: >60
GFR calc non Af Amer: >60
Glucose, Bld: 170 — ABNORMAL HIGH
Total Bilirubin: 0.5

## 2011-06-05 LAB — DIFFERENTIAL
Basophils Absolute: 0
Lymphocytes Relative: 7 — ABNORMAL LOW
Lymphs Abs: 0.8
Neutro Abs: 11.4 — ABNORMAL HIGH
Neutrophils Relative %: 92 — ABNORMAL HIGH

## 2011-06-05 LAB — POCT I-STAT, CHEM 8
BUN: 12
Calcium, Ion: 1.15
Chloride: 102
Creatinine, Ser: 0.8
Sodium: 136
TCO2: 21

## 2011-06-05 LAB — URINE CULTURE

## 2011-06-05 LAB — CREATININE, SERUM: GFR calc Af Amer: >60

## 2011-06-05 LAB — PREGNANCY, URINE: Preg Test, Ur: NEGATIVE

## 2011-07-22 ENCOUNTER — Other Ambulatory Visit: Payer: Self-pay | Admitting: Gynecology

## 2011-07-22 DIAGNOSIS — Z1231 Encounter for screening mammogram for malignant neoplasm of breast: Secondary | ICD-10-CM

## 2011-07-24 ENCOUNTER — Ambulatory Visit
Admission: RE | Admit: 2011-07-24 | Discharge: 2011-07-24 | Disposition: A | Discharge status: Home / Self Care | Payer: PRIVATE HEALTH INSURANCE | Source: Ambulatory Visit | Attending: Gynecology | Admitting: Gynecology

## 2011-07-24 DIAGNOSIS — Z1231 Encounter for screening mammogram for malignant neoplasm of breast: Secondary | ICD-10-CM

## 2011-10-20 ENCOUNTER — Other Ambulatory Visit: Payer: Self-pay

## 2011-10-20 MED ORDER — ESOMEPRAZOLE MAGNESIUM 40 MG PO CPDR: 40.0000 mg | DELAYED_RELEASE_CAPSULE | Freq: Every day | ORAL | Status: DC

## 2011-10-20 NOTE — Telephone Encounter (Signed)
[facility] faxed request Nexium 40 mg #90 x 1. Pt already CPX scheduled 01/21/12.

## 2011-11-07 ENCOUNTER — Other Ambulatory Visit: Payer: PRIVATE HEALTH INSURANCE

## 2011-11-12 ENCOUNTER — Encounter: Payer: PRIVATE HEALTH INSURANCE | Admitting: Family Medicine

## 2012-01-13 ENCOUNTER — Telehealth: Payer: Self-pay | Admitting: Family Medicine

## 2012-01-13 DIAGNOSIS — E785 Hyperlipidemia, unspecified: Secondary | ICD-10-CM

## 2012-01-13 DIAGNOSIS — Z0000 Encounter for general adult medical examination without abnormal findings: Secondary | ICD-10-CM | POA: Insufficient documentation

## 2012-01-13 NOTE — Telephone Encounter (Signed)
Message copied by Judy Pimple on Tue Jan 13, 2012 10:10 PM ------      Message from: Alvina Chou      Created: Tue Jan 13, 2012  4:52 PM      Regarding: labs for May 8       Patient is scheduled for CPX labs, please order future labs, Thanks , Camelia Eng

## 2012-01-14 ENCOUNTER — Other Ambulatory Visit (INDEPENDENT_AMBULATORY_CARE_PROVIDER_SITE_OTHER): Payer: PRIVATE HEALTH INSURANCE

## 2012-01-14 DIAGNOSIS — Z0000 Encounter for general adult medical examination without abnormal findings: Secondary | ICD-10-CM

## 2012-01-14 DIAGNOSIS — E785 Hyperlipidemia, unspecified: Secondary | ICD-10-CM

## 2012-01-14 LAB — CBC WITH DIFFERENTIAL/PLATELET
Basophils Absolute: 0.1 10*3/uL (ref 0.0–0.1)
Eosinophils Absolute: 0.1 10*3/uL (ref 0.0–0.7)
Eosinophils Relative: 1.9 % (ref 0.0–5.0)
Lymphs Abs: 2.1 10*3/uL (ref 0.7–4.0)
MCHC: 33.8 g/dL (ref 30.0–36.0)
MCV: 92.6 fl (ref 78.0–100.0)
Monocytes Absolute: 0.3 10*3/uL (ref 0.1–1.0)
Neutrophils Relative %: 56.3 % (ref 43.0–77.0)
Platelets: 247 10*3/uL (ref 150.0–400.0)
RDW: 13.8 % (ref 11.5–14.6)
WBC: 5.8 10*3/uL (ref 4.5–10.5)

## 2012-01-14 LAB — LIPID PANEL
LDL Cholesterol: 109 mg/dL — ABNORMAL HIGH (ref 0–99)
Total CHOL/HDL Ratio: 3
VLDL: 24.2 mg/dL (ref 0.0–40.0)

## 2012-01-14 LAB — COMPREHENSIVE METABOLIC PANEL
ALT: 15 U/L (ref 0–35)
AST: 17 U/L (ref 0–37)
Albumin: 3.7 g/dL (ref 3.5–5.2)
Alkaline Phosphatase: 61 U/L (ref 39–117)
Potassium: 4.5 mEq/L (ref 3.5–5.1)
Sodium: 140 mEq/L (ref 135–145)
Total Bilirubin: 0.4 mg/dL (ref 0.3–1.2)
Total Protein: 7.1 g/dL (ref 6.0–8.3)

## 2012-01-20 ENCOUNTER — Encounter: Payer: Self-pay | Admitting: Family Medicine

## 2012-01-21 ENCOUNTER — Encounter: Payer: Self-pay | Admitting: Family Medicine

## 2012-01-21 ENCOUNTER — Ambulatory Visit (INDEPENDENT_AMBULATORY_CARE_PROVIDER_SITE_OTHER): Payer: PRIVATE HEALTH INSURANCE | Admitting: Family Medicine

## 2012-01-21 VITALS — BP 120/80 | HR 64 | Temp 98.0°F | Ht 60.0 in | Wt 249.2 lb

## 2012-01-21 DIAGNOSIS — E669 Obesity, unspecified: Secondary | ICD-10-CM

## 2012-01-21 DIAGNOSIS — Z0000 Encounter for general adult medical examination without abnormal findings: Secondary | ICD-10-CM

## 2012-01-21 DIAGNOSIS — E785 Hyperlipidemia, unspecified: Secondary | ICD-10-CM

## 2012-01-21 NOTE — Progress Notes (Signed)
Subjective:    Patient ID: Raven Gibson, female    DOB: [birth year], 51 y.o.   MRN: [id number]  HPI Here for health maintenance exam and to review chronic medical problems   Is feeling ok  Nothing new medically    Wt is up 7 lb with bmi of 48 Is morbidly obese  With back issues can really not exercise at all - is more and more of a problem  Her prior surgery allowed her to walk  Deg disc- not much more she could do    Health habits- is trying to take care of herself  Eats pretty healthy , mostly chicken (not fried)  Is very very busy     Chemistry      Component Value Date/Time   NA 140 01/14/2012 1010   K 4.5 01/14/2012 1010   CL 104 01/14/2012 1010   CO2 28 01/14/2012 1010   BUN 13 01/14/2012 1010   CREATININE 1.0 01/14/2012 1010      Component Value Date/Time   CALCIUM 9.1 01/14/2012 1010   ALKPHOS 61 01/14/2012 1010   AST 17 01/14/2012 1010   ALT 15 01/14/2012 1010   BILITOT 0.4 01/14/2012 1010      Lab Results  Component Value Date   WBC 5.8 01/14/2012   HGB 13.9 01/14/2012   HCT 41.0 01/14/2012   MCV 92.6 01/14/2012   PLT 247.0 01/14/2012    Lab Results  Component Value Date   TSH 2.33 01/14/2012    Lab Results  Component Value Date   CHOL 190 01/14/2012   HDL 56.40 01/14/2012   LDLCALC 109* 01/14/2012   LDLDIRECT 130.8 09/11/2009   TRIG 121.0 01/14/2012   CHOLHDL 3 01/14/2012   improved   mammo 11/12 Self exam - no lumps or changes   Pap 9/09- is the last one  Gyn-no problems , and periods are slacking off - less frequent - one in April (only one in 2013)  Does not want a gyn exam  No recent abn paps   bp good 120/80  Colon cancer screening - does not want to do that now  Also declines an ifob   Never smoked - just 2nd hand - better now   gerd-on nexium- it does help most of the time Has symptoms if she eats the wrong things or eats late   Patient Active Problem List  Diagnoses  . HYPERCHOLESTEROLEMIA, BORDERLINE  . REACTIVE AIRWAY DISEASE  . GERD  . OSTEOARTHRITIS  .  DEGENERATIVE DISC DISEASE  . LEUKOCYTOSIS  . Routine general medical examination at a health care facility  . Obesity   Past Medical History  Diagnosis Date  . GERD (gastroesophageal reflux disease)   . Osteoporosis   . Fatty liver   . Obesity   . DDD (degenerative disc disease)    Past Surgical History  Procedure Date  . Lumbar spine surgery     ruptured disk   History  Substance Use Topics  . Smoking status: Never Smoker   . Smokeless tobacco: Not on file  . Alcohol Use: No   Family History  Problem Relation Age of Onset  . Hypertension Mother   . Hyperlipidemia Mother   . Hypertension Father     MI x 3   Allergies  Allergen Reactions  . Codeine     REACTION: nausea and vomiting   Current Outpatient Prescriptions on File Prior to Visit  Medication Sig Dispense Refill  . Ascorbic Acid (VITAMIN C) 1000  MG tablet Take 1,000 mg by mouth daily.      Marland Kitchen esomeprazole (NEXIUM) 40 MG capsule Take 1 capsule (40 mg total) by mouth daily before breakfast.  90 capsule  1  . multivitamin-lutein (OCUVITE-LUTEIN) CAPS Take 1 capsule by mouth daily.           Review of Systems Review of Systems  Constitutional: Negative for fever, appetite change,  and unexpected weight change. fatigue at times  Eyes: Negative for pain and visual disturbance.  Respiratory: Negative for cough and shortness of breath.   Cardiovascular: Negative for cp or palpitations    Gastrointestinal: Negative for nausea, diarrhea and constipation.  Genitourinary: Negative for urgency and frequency.  Skin: Negative for pallor or rash   MSK pos for chronic low back pain  Neurological: Negative for weakness, light-headedness, numbness and headaches.  Hematological: Negative for adenopathy. Does not bruise/bleed easily.  Psychiatric/Behavioral: Negative for dysphoric mood. The patient is not nervous/anxious.         Objective:   Physical Exam  Constitutional: She appears well-nourished. No distress.        Morbidly obese and well app  HENT:  Head: Normocephalic and atraumatic.  Right Ear: External ear normal.  Left Ear: External ear normal.  Nose: Nose normal.  Mouth/Throat: Oropharynx is clear and moist.  Eyes: Conjunctivae and EOM are normal. Pupils are equal, round, and reactive to light. No scleral icterus.  Neck: Normal range of motion. Neck supple. No JVD present. Carotid bruit is not present. No thyromegaly present.  Cardiovascular: Normal rate, regular rhythm, normal heart sounds and intact distal pulses.  Exam reveals no gallop.   Pulmonary/Chest: Effort normal and breath sounds normal. No respiratory distress. She has no wheezes. She has no rales.  Abdominal: Soft. Bowel sounds are normal. She exhibits no distension, no abdominal bruit and no mass. There is no tenderness.  Genitourinary:       Pt declines breast and pelvic exams   Musculoskeletal: Normal range of motion. She exhibits no edema and no tenderness.  Lymphadenopathy:    She has no cervical adenopathy.  Neurological: She is alert. She has normal reflexes. No cranial nerve deficit. She exhibits normal muscle tone. Coordination normal.  Skin: Skin is warm and dry. No rash noted. No erythema. No pallor.       Some skin tags and lentigos  Psychiatric: She has a normal mood and affect.          Assessment & Plan:

## 2012-01-21 NOTE — Assessment & Plan Note (Signed)
Improved today Disc goals for lipids and reasons to control them Rev labs with pt Rev low sat fat diet in detail  Enc her to keep working on healthy diet and wt loss

## 2012-01-21 NOTE — Assessment & Plan Note (Signed)
Reviewed health habits including diet and exercise and skin cancer prevention Also reviewed health mt list, fam hx and immunizations   Understands the risks her wt puts on her Rev wellness labs Declines any colon cancer screening / gyn exam or breast exam today

## 2012-01-21 NOTE — Patient Instructions (Signed)
Think about water exercise or exercise bike Eat a healthy diet  Labs look ok  No change in medicine  Try to get 1200-1500 mg of calcium per day with at least 1000 iu of vitamin D - for bone health

## 2012-01-21 NOTE — Assessment & Plan Note (Signed)
Discussed how this problem influences overall health and the risks it imposes  Reviewed plan for weight loss with lower calorie diet (via better food choices and also portion control or program like weight watchers) and exercise building up to or more than 30 minutes 5 days per week including some aerobic activity  Unfortunately her chronic back condition keeps her very sedentary - and menopause is not helping We did disc trial of water exercise or low impact bike

## 2012-04-29 ENCOUNTER — Other Ambulatory Visit: Payer: Self-pay

## 2012-04-29 MED ORDER — ESOMEPRAZOLE MAGNESIUM 40 MG PO CPDR: 40.0000 mg | DELAYED_RELEASE_CAPSULE | Freq: Every day | ORAL | Status: DC

## 2012-04-29 NOTE — Telephone Encounter (Signed)
Rx nexium  40 mg #90 [facility].

## 2012-07-26 ENCOUNTER — Other Ambulatory Visit: Payer: Self-pay | Admitting: Gynecology

## 2012-07-26 DIAGNOSIS — Z1231 Encounter for screening mammogram for malignant neoplasm of breast: Secondary | ICD-10-CM

## 2012-08-26 ENCOUNTER — Other Ambulatory Visit (HOSPITAL_COMMUNITY)
Admission: RE | Admit: 2012-08-26 | Discharge: 2012-08-26 | Disposition: A | Discharge status: Home / Self Care | Payer: PRIVATE HEALTH INSURANCE | Source: Ambulatory Visit | Attending: Women's Health | Admitting: Women's Health

## 2012-08-26 ENCOUNTER — Encounter: Payer: Self-pay | Admitting: Women's Health

## 2012-08-26 ENCOUNTER — Ambulatory Visit (INDEPENDENT_AMBULATORY_CARE_PROVIDER_SITE_OTHER): Payer: PRIVATE HEALTH INSURANCE | Admitting: Women's Health

## 2012-08-26 VITALS — BP 138/80 | Ht 60.0 in | Wt 246.0 lb

## 2012-08-26 DIAGNOSIS — Z01419 Encounter for gynecological examination (general) (routine) without abnormal findings: Secondary | ICD-10-CM | POA: Insufficient documentation

## 2012-08-26 DIAGNOSIS — Z23 Encounter for immunization: Secondary | ICD-10-CM

## 2012-08-26 DIAGNOSIS — Z1151 Encounter for screening for human papillomavirus (HPV): Secondary | ICD-10-CM | POA: Insufficient documentation

## 2012-08-26 DIAGNOSIS — N912 Amenorrhea, unspecified: Secondary | ICD-10-CM

## 2012-08-26 NOTE — Addendum Note (Signed)
Addended by: Leonard Schwartz A on: 08/26/2012 03:46 PM   Modules accepted: Orders

## 2012-08-26 NOTE — Patient Instructions (Addendum)
Vit d 2000 daily Health Maintenance, Females A healthy lifestyle and preventative care can promote health and wellness.  Maintain regular health, dental, and eye exams.  Eat a healthy diet. Foods like vegetables, fruits, whole grains, low-fat dairy products, and lean protein foods contain the nutrients you need without too many calories. Decrease your intake of foods high in solid fats, added sugars, and salt. Get information about a proper diet from your caregiver, if necessary.  Regular physical exercise is one of the most important things you can do for your health. Most adults should get at least 150 minutes of moderate-intensity exercise (any activity that increases your heart rate and causes you to sweat) each week. In addition, most adults need muscle-strengthening exercises on 2 or more days a week.   Maintain a healthy weight. The body mass index (BMI) is a screening tool to identify possible weight problems. It provides an estimate of body fat based on height and weight. Your caregiver can help determine your BMI, and can help you achieve or maintain a healthy weight. For adults 20 years and older:  A BMI below 18.5 is considered underweight.  A BMI of 18.5 to 24.9 is normal.  A BMI of 25 to 29.9 is considered overweight.  A BMI of 30 and above is considered obese.  Maintain normal blood lipids and cholesterol by exercising and minimizing your intake of saturated fat. Eat a balanced diet with plenty of fruits and vegetables. Blood tests for lipids and cholesterol should begin at age 58 and be repeated every 5 years. If your lipid or cholesterol levels are high, you are over 50, or you are a high risk for heart disease, you may need your cholesterol levels checked more frequently.Ongoing high lipid and cholesterol levels should be treated with medicines if diet and exercise are not effective.  If you smoke, find out from your caregiver how to quit. If you do not use tobacco, do not  start.  If you are pregnant, do not drink alcohol. If you are breastfeeding, be very cautious about drinking alcohol. If you are not pregnant and choose to drink alcohol, do not exceed 1 drink per day. One drink is considered to be 12 ounces (355 mL) of beer, 5 ounces (148 mL) of wine, or 1.5 ounces (44 mL) of liquor.  Avoid use of street drugs. Do not share needles with anyone. Ask for help if you need support or instructions about stopping the use of drugs.  High blood pressure causes heart disease and increases the risk of stroke. Blood pressure should be checked at least every 1 to 2 years. Ongoing high blood pressure should be treated with medicines, if weight loss and exercise are not effective.  If you are 20 to 50 years old, ask your caregiver if you should take aspirin to prevent strokes.  Diabetes screening involves taking a blood sample to check your fasting blood sugar level. This should be done once every 3 years, after age 51, if you are within normal weight and without risk factors for diabetes. Testing should be considered at a younger age or be carried out more frequently if you are overweight and have at least 1 risk factor for diabetes.  Breast cancer screening is essential preventative care for women. You should practice "breast self-awareness." This means understanding the normal appearance and feel of your breasts and may include breast self-examination. Any changes detected, no matter how small, should be reported to a caregiver. Women in their 27s  and 30s should have a clinical breast exam (CBE) by a caregiver as part of a regular health exam every 1 to 3 years. After age 27, women should have a CBE every year. Starting at age 17, women should consider having a mammogram (breast X-ray) every year. Women who have a family history of breast cancer should talk to their caregiver about genetic screening. Women at a high risk of breast cancer should talk to their caregiver about having  an MRI and a mammogram every year.  The Pap test is a screening test for cervical cancer. Women should have a Pap test starting at age 78. Between ages 38 and 78, Pap tests should be repeated every 2 years. Beginning at age 51, you should have a Pap test every 3 years as long as the past 3 Pap tests have been normal. If you had a hysterectomy for a problem that was not cancer or a condition that could lead to cancer, then you no longer need Pap tests. If you are between ages 47 and 55, and you have had normal Pap tests going back 10 years, you no longer need Pap tests. If you have had past treatment for cervical cancer or a condition that could lead to cancer, you need Pap tests and screening for cancer for at least 20 years after your treatment. If Pap tests have been discontinued, risk factors (such as a new sexual partner) need to be reassessed to determine if screening should be resumed. Some women have medical problems that increase the chance of getting cervical cancer. In these cases, your caregiver may recommend more frequent screening and Pap tests.  The human papillomavirus (HPV) test is an additional test that may be used for cervical cancer screening. The HPV test looks for the virus that can cause the cell changes on the cervix. The cells collected during the Pap test can be tested for HPV. The HPV test could be used to screen women aged 59 years and older, and should be used in women of any age who have unclear Pap test results. After the age of 67, women should have HPV testing at the same frequency as a Pap test.  Colorectal cancer can be detected and often prevented. Most routine colorectal cancer screening begins at the age of 72 and continues through age 63. However, your caregiver may recommend screening at an earlier age if you have risk factors for colon cancer. On a yearly basis, your caregiver may provide home test kits to check for hidden blood in the stool. Use of a small camera at  the end of a tube, to directly examine the colon (sigmoidoscopy or colonoscopy), can detect the earliest forms of colorectal cancer. Talk to your caregiver about this at age 64, when routine screening begins. Direct examination of the colon should be repeated every 5 to 10 years through age 61, unless early forms of pre-cancerous polyps or small growths are found.  Hepatitis C blood testing is recommended for all people born from [birth year] through 1965 and any individual with known risks for hepatitis C.  Practice safe sex. Use condoms and avoid high-risk sexual practices to reduce the spread of sexually transmitted infections (STIs). Sexually active women aged 52 and younger should be checked for Chlamydia, which is a common sexually transmitted infection. Older women with new or multiple partners should also be tested for Chlamydia. Testing for other STIs is recommended if you are sexually active and at increased risk.  Osteoporosis is a  disease in which the bones lose minerals and strength with aging. This can result in serious bone fractures. The risk of osteoporosis can be identified using a bone density scan. Women ages 36 and over and women at risk for fractures or osteoporosis should discuss screening with their caregivers. Ask your caregiver whether you should be taking a calcium supplement or vitamin D to reduce the rate of osteoporosis.  Menopause can be associated with physical symptoms and risks. Hormone replacement therapy is available to decrease symptoms and risks. You should talk to your caregiver about whether hormone replacement therapy is right for you.  Use sunscreen with a sun protection factor (SPF) of 30 or greater. Apply sunscreen liberally and repeatedly throughout the day. You should seek shade when your shadow is shorter than you. Protect yourself by wearing long sleeves, pants, a wide-brimmed hat, and sunglasses year round, whenever you are outdoors.  Notify your caregiver of new  moles or changes in moles, especially if there is a change in shape or color. Also notify your caregiver if a mole is larger than the size of a pencil eraser.  Stay current with your immunizations. Document Released: 03/10/2011 Document Revised: 11/17/2011 Document Reviewed: 03/10/2011 [facility] Patient Information 2013 [place], [place].

## 2012-08-26 NOTE — Progress Notes (Signed)
Raven Gibson 12-15-1960 [id number]    History:    The patient presents for annual exam. Last annual exam here 2009. Irregular cycles for past 2 years, last cycle June 2013. Has chronic back pain from a work back injury. Not sexually active, husband with COPD. 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 History of fibroids, fundal fibroid 59 x 47 x 42 mm - 2007. History of normal mammograms. Has not had a colonoscopy. Labs at primary care.   Past medical history, past surgical history, family history and social history were all reviewed and documented in the EPIC chart. Works at same [employer] job. Numerous family members with hypertension.  ROS:  A  ROS was performed and pertinent positives and negatives are included in the history.  Exam:  Filed Vitals:   08/26/12 1435  BP: 138/80    General appearance:  Normal Head/Neck:  Normal, without cervical or supraclavicular adenopathy. Thyroid:  Symmetrical, normal in size, without palpable masses or nodularity. Respiratory  Effort:  Normal  Auscultation:  Clear without wheezing or rhonchi Cardiovascular  Auscultation:  Regular rate, without rubs, murmurs or gallops  Edema/varicosities:  Not grossly evident Abdominal  Soft,nontender, without masses, guarding or rebound.  Liver/spleen:  No organomegaly noted  Hernia:  None appreciated  Skin  Inspection:  Grossly normal  Palpation:  Grossly normal Neurologic/psychiatric  Orientation:  Normal with appropriate conversation.  Mood/affect:  Normal  Genitourinary    Breasts: Examined lying and sitting.     Right: Without masses, retractions, discharge or axillary adenopathy.     Left: Without masses, retractions, discharge or axillary adenopathy.   Inguinal/mons:  Normal without inguinal adenopathy  External genitalia:  Normal  BUS/Urethra/Skene's glands:  Normal  Bladder:  Normal  Vagina:  Normal  Cervix:  Normal  Uterus:   normal in size, shape and contour.  Midline and  mobile  Adnexa/parametria:     Rt: Without masses or tenderness.   Lt: Without masses or tenderness.  Anus and perineum: Normal  Digital rectal exam: Normal sphincter tone without palpated masses or tenderness  Assessment/Plan:  51 y.o. M. WF G0 for annual exam.    Last menstrual cycle June 2013 Chronic back pain/back injury GERD/borderline hypercholesteremia-Labs primary care. Obesity  Plan: FSH, if menopausal bone density. SBE's, continue annual mammogram, calcium rich diet, vitamin D 2000 daily encouraged. Reviewed importance of increasing regular exercise as able and decreasing calories for weight loss and health. Colonoscopy, will get scheduled. Pap, reviewed new screening guidelines. Last normal Pap 2009. Flu vaccine today.    Harrington Challenger [facility], 3:31 PM 08/26/2012

## 2012-08-27 ENCOUNTER — Ambulatory Visit
Admission: RE | Admit: 2012-08-27 | Discharge: 2012-08-27 | Disposition: A | Discharge status: Home / Self Care | Payer: PRIVATE HEALTH INSURANCE | Source: Ambulatory Visit | Attending: Gynecology | Admitting: Gynecology

## 2012-08-27 DIAGNOSIS — Z1231 Encounter for screening mammogram for malignant neoplasm of breast: Secondary | ICD-10-CM

## 2012-08-27 LAB — FOLLICLE STIMULATING HORMONE: FSH: 71.8 m[IU]/mL

## 2012-09-03 ENCOUNTER — Encounter: Payer: Self-pay | Admitting: Women's Health

## 2013-01-03 ENCOUNTER — Other Ambulatory Visit: Payer: Self-pay | Admitting: Family Medicine

## 2013-04-05 ENCOUNTER — Telehealth: Payer: Self-pay | Admitting: Family Medicine

## 2013-04-05 DIAGNOSIS — E785 Hyperlipidemia, unspecified: Secondary | ICD-10-CM

## 2013-04-05 DIAGNOSIS — Z0000 Encounter for general adult medical examination without abnormal findings: Secondary | ICD-10-CM

## 2013-04-05 NOTE — Telephone Encounter (Signed)
Message copied by Judy Pimple on Tue Apr 05, 2013 10:14 PM ------      Message from: Alvina Chou      Created: Thu Mar 24, 2013  3:03 PM      Regarding: Lab orders for Wednesday, 7.30.14       Patient is scheduled for CPX labs, please order future labs, Thanks , Terri       ------

## 2013-04-06 ENCOUNTER — Other Ambulatory Visit (INDEPENDENT_AMBULATORY_CARE_PROVIDER_SITE_OTHER): Payer: PRIVATE HEALTH INSURANCE

## 2013-04-06 ENCOUNTER — Other Ambulatory Visit: Payer: PRIVATE HEALTH INSURANCE

## 2013-04-06 DIAGNOSIS — E785 Hyperlipidemia, unspecified: Secondary | ICD-10-CM

## 2013-04-06 DIAGNOSIS — Z0000 Encounter for general adult medical examination without abnormal findings: Secondary | ICD-10-CM

## 2013-04-06 LAB — CBC WITH DIFFERENTIAL/PLATELET
Basophils Absolute: 0 10*3/uL (ref 0.0–0.1)
Eosinophils Absolute: 0.1 10*3/uL (ref 0.0–0.7)
HCT: 41.7 % (ref 36.0–46.0)
Lymphocytes Relative: 38.2 % (ref 12.0–46.0)
Lymphs Abs: 2.3 10*3/uL (ref 0.7–4.0)
MCHC: 32.9 g/dL (ref 30.0–36.0)
Monocytes Relative: 5.4 % (ref 3.0–12.0)
Neutro Abs: 3.2 10*3/uL (ref 1.4–7.7)
Platelets: 265 10*3/uL (ref 150.0–400.0)
RDW: 13.4 % (ref 11.5–14.6)

## 2013-04-06 LAB — COMPREHENSIVE METABOLIC PANEL
ALT: 15 U/L (ref 0–35)
AST: 15 U/L (ref 0–37)
CO2: 29 mEq/L (ref 19–32)
Creatinine, Ser: 0.9 mg/dL (ref 0.4–1.2)
GFR: 70.09 mL/min (ref >60.00)
Sodium: 141 mEq/L (ref 135–145)
Total Bilirubin: 0.4 mg/dL (ref 0.3–1.2)
Total Protein: 6.8 g/dL (ref 6.0–8.3)

## 2013-04-06 LAB — LIPID PANEL
HDL: 52.6 mg/dL (ref >39.00)
LDL Cholesterol: 102 mg/dL — ABNORMAL HIGH (ref 0–99)
Total CHOL/HDL Ratio: 3
VLDL: 28.8 mg/dL (ref 0.0–40.0)

## 2013-04-06 LAB — TSH: TSH: 2.27 u[IU]/mL (ref 0.35–5.50)

## 2013-04-13 ENCOUNTER — Ambulatory Visit (INDEPENDENT_AMBULATORY_CARE_PROVIDER_SITE_OTHER): Payer: PRIVATE HEALTH INSURANCE | Admitting: Family Medicine

## 2013-04-13 ENCOUNTER — Encounter: Payer: Self-pay | Admitting: Family Medicine

## 2013-04-13 VITALS — BP 126/88 | HR 72 | Temp 98.3°F | Ht 59.0 in | Wt 247.2 lb

## 2013-04-13 DIAGNOSIS — Z1211 Encounter for screening for malignant neoplasm of colon: Secondary | ICD-10-CM

## 2013-04-13 DIAGNOSIS — Z0000 Encounter for general adult medical examination without abnormal findings: Secondary | ICD-10-CM

## 2013-04-13 DIAGNOSIS — E785 Hyperlipidemia, unspecified: Secondary | ICD-10-CM

## 2013-04-13 DIAGNOSIS — Z23 Encounter for immunization: Secondary | ICD-10-CM

## 2013-04-13 NOTE — Patient Instructions (Addendum)
Think about a recumbent exercise bike or elliptical for exercise  Don't forget to get your flu shot in the fall Pneumonia vaccine today Please do the IFOB card for colon cancer screening

## 2013-04-13 NOTE — Progress Notes (Signed)
Subjective:    Patient ID: Raven Gibson, female    DOB: December 08, 1960, 52 y.o.   MRN: [id number]  HPI Here for health maintenance exam and to review chronic medical problems   Doing well overall Went to the beach this summer - it was really relaxing   husb has copd - struggling with that   Is feeling ok overall -not a lot of menopausal symptoms (she is hot natured anyway) She does have more aches and pains in general - tends to have a high tolerance for pain  Mood is ok   Wt is up 1 lb with bmi of 49  Colon screen- she is not interested in the test (had a traumatic flex sig in the past) Wants to do the IFOB instead   Flu vaccine 12/13 Gyn exam and pap 12/13 with gyn-normal Mammogram nl 12/13 Self exam-no breast lumps   Td 11/10 up to date   Pneumovax 12/07-- 7 years ago   Mood - is ok   Labs are ok   Lab Results  Component Value Date   CHOL 183 04/06/2013   CHOL 190 01/14/2012   CHOL 192 10/23/2010   Lab Results  Component Value Date   HDL 52.60 04/06/2013   HDL 40.98 01/14/2012   HDL 51.90 10/23/2010   Lab Results  Component Value Date   LDLCALC 102* 04/06/2013   LDLCALC 109* 01/14/2012   LDLCALC 107* 10/23/2010   Lab Results  Component Value Date   TRIG 144.0 04/06/2013   TRIG 121.0 01/14/2012   TRIG 165.0* 10/23/2010   Lab Results  Component Value Date   CHOLHDL 3 04/06/2013   CHOLHDL 3 01/14/2012   CHOLHDL 4 10/23/2010   Lab Results  Component Value Date   LDLDIRECT 130.8 09/11/2009   LDLDIRECT 124.2 09/15/2007   fairly good control  She eats chicken for her protein for the most part - very seldom has fried food- but generally avoids it   Exercise- is thinking about getting a machine- perhaps an elliptical  Walking bothers her    Patient Active Problem List   Diagnosis Date Noted  . Obesity 01/21/2012  . Routine general medical examination at a health care facility 01/13/2012  . LEUKOCYTOSIS 10/29/2010  . HYPERCHOLESTEROLEMIA, BORDERLINE 09/14/2007  .  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 GERD 09/14/2007  . OSTEOARTHRITIS 09/14/2007  . DEGENERATIVE DISC DISEASE 09/14/2007   Past Medical History  Diagnosis Date  . GERD (gastroesophageal reflux disease)   . Fatty liver   . Obesity   . DDD (degenerative disc disease)    Past Surgical History  Procedure Laterality Date  . Lumbar spine surgery      ruptured disk  . Cholecystectomy     History  Substance Use Topics  . Smoking status: Never Smoker   . Smokeless tobacco: Not on file  . Alcohol Use: No   Family History  Problem Relation Age of Onset  . Hypertension Mother   . Hyperlipidemia Mother   . Hypertension Father     MI x 3   Allergies  Allergen Reactions  . Codeine     REACTION: nausea and vomiting   Current Outpatient Prescriptions on File Prior to Visit  Medication Sig Dispense Refill  . Ascorbic Acid (VITAMIN C) 1000 MG tablet Take 1,000 mg by mouth daily.      Marland Kitchen NEXIUM 40 MG capsule TAKE ONE CAPSULE BY MOUTH EVERY MORNING  30 capsule  2   No current facility-administered medications on  file prior to visit.     Review of Systems Review of Systems  Constitutional: Negative for fever, appetite change, fatigue and unexpected weight change.  Eyes: Negative for pain and visual disturbance.  Respiratory: Negative for cough and shortness of breath.   Cardiovascular: Negative for cp or palpitations    Gastrointestinal: Negative for nausea, diarrhea and constipation.  Genitourinary: Negative for urgency and frequency.  Skin: Negative for pallor or rash   Neurological: Negative for weakness, light-headedness, numbness and headaches.  Hematological: Negative for adenopathy. Does not bruise/bleed easily.  Psychiatric/Behavioral: Negative for dysphoric mood. The patient is not nervous/anxious.         Objective:   Physical Exam  Constitutional: She appears well-developed and well-nourished. No distress.  Morbidly obese and well appearing  HENT:  Head:  Normocephalic and atraumatic.  Right Ear: External ear normal.  Left Ear: External ear normal.  Nose: Nose normal.  Mouth/Throat: Oropharynx is clear and moist.  Eyes: Conjunctivae and EOM are normal. Pupils are equal, round, and reactive to light. Right eye exhibits no discharge. Left eye exhibits no discharge. No scleral icterus.  Neck: Normal range of motion. Neck supple. No JVD present. Carotid bruit is not present. No thyromegaly present.  Cardiovascular: Normal rate, regular rhythm, normal heart sounds and intact distal pulses.  Exam reveals no gallop.   Pulmonary/Chest: Effort normal and breath sounds normal. No respiratory distress. She has no wheezes.  Abdominal: Soft. Bowel sounds are normal. She exhibits no distension, no abdominal bruit and no mass. There is no tenderness.  Musculoskeletal: She exhibits no edema and no tenderness.  Lymphadenopathy:    She has no cervical adenopathy.  Neurological: She is alert. She has normal reflexes. No cranial nerve deficit. She exhibits normal muscle tone. Coordination normal.  Skin: Skin is warm and dry. No rash noted. No erythema. No pallor.  Psychiatric: She has a normal mood and affect.          Assessment & Plan:

## 2013-04-14 NOTE — Assessment & Plan Note (Signed)
D/w patient re:options for colon cancer screening, including IFOB vs. colonoscopy.  Risks and benefits of both were discussed and patient voiced understanding.  Pt elects for:IFOB card  

## 2013-04-14 NOTE — Assessment & Plan Note (Signed)
Disc goals for lipids and reasons to control them Rev labs with pt Rev low sat fat diet in detail  Good control

## 2013-04-14 NOTE — Assessment & Plan Note (Signed)
Discussed how this problem influences overall health and the risks it imposes  Reviewed plan for weight loss with lower calorie diet (via better food choices and also portion control or program like weight watchers) and exercise building up to or more than 30 minutes 5 days per week including some aerobic activity    

## 2013-04-14 NOTE — Assessment & Plan Note (Signed)
Reviewed health habits including diet and exercise and skin cancer prevention Also reviewed health mt list, fam hx and immunizations   Disc need for weight loss  Disc some options for exercise

## 2013-04-26 ENCOUNTER — Other Ambulatory Visit (INDEPENDENT_AMBULATORY_CARE_PROVIDER_SITE_OTHER): Payer: PRIVATE HEALTH INSURANCE

## 2013-04-26 DIAGNOSIS — Z1211 Encounter for screening for malignant neoplasm of colon: Secondary | ICD-10-CM

## 2013-04-26 LAB — FECAL OCCULT BLOOD, IMMUNOCHEMICAL: Fecal Occult Bld: NEGATIVE

## 2013-07-14 ENCOUNTER — Other Ambulatory Visit: Payer: Self-pay

## 2013-08-08 ENCOUNTER — Other Ambulatory Visit: Payer: Self-pay | Admitting: Gynecology

## 2013-08-08 DIAGNOSIS — Z1231 Encounter for screening mammogram for malignant neoplasm of breast: Secondary | ICD-10-CM

## 2013-08-30 ENCOUNTER — Ambulatory Visit
Admission: RE | Admit: 2013-08-30 | Discharge: 2013-08-30 | Disposition: A | Discharge status: Home / Self Care | Payer: PRIVATE HEALTH INSURANCE | Source: Ambulatory Visit | Attending: Gynecology | Admitting: Gynecology

## 2013-08-30 DIAGNOSIS — Z1231 Encounter for screening mammogram for malignant neoplasm of breast: Secondary | ICD-10-CM

## 2013-08-31 ENCOUNTER — Ambulatory Visit (INDEPENDENT_AMBULATORY_CARE_PROVIDER_SITE_OTHER): Payer: PRIVATE HEALTH INSURANCE | Admitting: Women's Health

## 2013-08-31 ENCOUNTER — Encounter: Payer: Self-pay | Admitting: Women's Health

## 2013-08-31 VITALS — BP 112/60 | Ht 60.0 in | Wt 242.2 lb

## 2013-08-31 DIAGNOSIS — Z780 Asymptomatic menopausal state: Secondary | ICD-10-CM

## 2013-08-31 DIAGNOSIS — Z01419 Encounter for gynecological examination (general) (routine) without abnormal findings: Secondary | ICD-10-CM

## 2013-08-31 DIAGNOSIS — Z23 Encounter for immunization: Secondary | ICD-10-CM

## 2013-08-31 NOTE — Progress Notes (Signed)
Raven Gibson [birth year] [id number]    History:    The patient presents for annual exam.  Postmenopausal with no bleeding, FSH 72  08/2012. 1993 CIN-1 LEEP with normal Paps after. Normal mammogram history. Fibroids asymptomatic, ultrasound 2007 fibroid 59 x 47 x 42 mm.  GERD/degenerative disc disease primary care manages.  Past medical history, past surgical history, family history and social history were all reviewed and documented in the EPIC chart. Works at [employer]. Not sexually active husbands health. Has not had a colonoscopy. Cholecystectomy. Parents hypertension.   ROS:  A  ROS was performed and pertinent positives and negatives are included in the history.  Exam:  Filed Vitals:   08/31/13 1214  BP: 112/60    General appearance:  Normal Head/Neck:  Normal, without cervical or supraclavicular adenopathy. Thyroid:  Symmetrical, normal in size, without palpable masses or nodularity. Respiratory  Effort:  Normal  Auscultation:  Clear without wheezing or rhonchi Cardiovascular  Auscultation:  Regular rate, without rubs, murmurs or gallops  Edema/varicosities:  Not grossly evident Abdominal  Soft,nontender, without masses, guarding or rebound.  Liver/spleen:  No organomegaly noted  Hernia:  None appreciated  Skin  Inspection:  Grossly normal  Palpation:  Grossly normal Neurologic/psychiatric  Orientation:  Normal with appropriate conversation.  Mood/affect:  Normal  Genitourinary    Breasts: Examined lying and sitting.     Right: Without masses, retractions, discharge or axillary adenopathy.     Left: Without masses, retractions, discharge or axillary adenopathy.   Inguinal/mons:  Normal without inguinal adenopathy  External genitalia:  Normal  BUS/Urethra/Skene's glands:  Normal  Bladder:  Normal  Vagina:  Normal  Cervix:  Normal  Uterus:  Fibroid, shape and contour.  Midline and mobile  Adnexa/parametria:     Rt: Without masses or tenderness.   Lt: Without  masses or tenderness.  Anus and perineum: Normal  Digital rectal exam: Normal sphincter tone without palpated masses or tenderness  Assessment/Plan:  52 y.o. MWF G0 for annual exam.    Morbid obesity 93 CIN-1 LEEP normal Paps after GERD/degenerative disc disease-primary care manages labs and meds Fibroid uterus  Plan: Reviewed benefits of screening colonoscopy instructed to schedule. SBE's, continue annual mammogram, calcium rich diet, vitamin D 2000 daily. Reviewed importance of decreasing calories and increasing exercise for weight loss. DEXA, instructed to schedule. Pap normal 2013, new screening guidelines reviewed.    Harrington Challenger WHNP, 2:00 PM 08/31/2013

## 2013-08-31 NOTE — Patient Instructions (Signed)
Health Recommendations for Postmenopausal Women Respected and ongoing research has looked at the most common causes of death, disability, and poor quality of life in postmenopausal women. The causes include heart disease, diseases of blood vessels, diabetes, depression, cancer, and bone loss (osteoporosis). Many things can be done to help lower the chances of developing these and other common problems: CARDIOVASCULAR DISEASE Heart Disease: A heart attack is a medical emergency. Know the signs and symptoms of a heart attack. Below are things women can do to reduce their risk for heart disease.   Do not smoke. If you smoke, quit.  Aim for a healthy weight. Being overweight causes many preventable deaths. Eat a healthy and balanced diet and drink an adequate amount of liquids.  Get moving. Make a commitment to be more physically active. Aim for 30 minutes of activity on most, if not all days of the week.  Eat for heart health. Choose a diet that is low in saturated fat and cholesterol and eliminate trans fat. Include whole grains, vegetables, and fruits. Read and understand the labels on food containers before buying.  Know your numbers. Ask your caregiver to check your blood pressure, cholesterol (total, HDL, LDL, triglycerides) and blood glucose. Work with your caregiver on improving your entire clinical picture.  High blood pressure. Limit or stop your table salt intake (try salt substitute and food seasonings). Avoid salty foods and drinks. Read labels on food containers before buying. Eating well and exercising can help control high blood pressure. STROKE  Stroke is a medical emergency. Stroke may be the result of a blood clot in a blood vessel in the brain or by a brain hemorrhage (bleeding). Know the signs and symptoms of a stroke. To lower the risk of developing a stroke:  Avoid fatty foods.  Quit smoking.  Control your diabetes, blood pressure, and irregular heart rate. THROMBOPHLEBITIS  (BLOOD CLOT) OF THE LEG  Becoming overweight and leading a stationary lifestyle may also contribute to developing blood clots. Controlling your diet and exercising will help lower the risk of developing blood clots. CANCER SCREENING  Breast Cancer: Take steps to reduce your risk of breast cancer.  You should practice "breast self-awareness." This means understanding the normal appearance and feel of your breasts and should include breast self-examination. Any changes detected, no matter how small, should be reported to your caregiver.  After age 40, you should have a clinical breast exam (CBE) every year.  Starting at age 40, you should consider having a mammogram (breast X-ray) every year.  If you have a family history of breast cancer, talk to your caregiver about genetic screening.  If you are at high risk for breast cancer, talk to your caregiver about having an MRI and a mammogram every year.  Intestinal or Stomach Cancer: Tests to consider are a rectal exam, fecal occult blood, sigmoidoscopy, and colonoscopy. Women who are high risk may need to be screened at an earlier age and more often.  Cervical Cancer:  Beginning at age 30, you should have a Pap test every 3 years as long as the past 3 Pap tests have been normal.  If you have had past treatment for cervical cancer or a condition that could lead to cancer, you need Pap tests and screening for cancer for at least 20 years after your treatment.  If you had a hysterectomy for a problem that was not cancer or a condition that could lead to cancer, then you no longer need Pap tests.    If you are between ages 65 and 70, and you have had normal Pap tests going back 10 years, you no longer need Pap tests.  If Pap tests have been discontinued, risk factors (such as a new sexual partner) need to be reassessed to determine if screening should be resumed.  Some medical problems can increase the chance of getting cervical cancer. In these  cases, your caregiver may recommend more frequent screening and Pap tests.  Uterine Cancer: If you have vaginal bleeding after reaching menopause, you should notify your caregiver.  Ovarian cancer: Other than yearly pelvic exams, there are no reliable tests available to screen for ovarian cancer at this time except for yearly pelvic exams.  Lung Cancer: Yearly chest X-rays can detect lung cancer and should be done on high risk women, such as cigarette smokers and women with chronic lung disease (emphysema).  Skin Cancer: A complete body skin exam should be done at your yearly examination. Avoid overexposure to the sun and ultraviolet light lamps. Use a strong sun block cream when in the sun. All of these things are important in lowering the risk of skin cancer. MENOPAUSE Menopause Symptoms: Hormone therapy products are effective for treating symptoms associated with menopause:  Moderate to severe hot flashes.  Night sweats.  Mood swings.  Headaches.  Tiredness.  Loss of sex drive.  Insomnia.  Other symptoms. Hormone replacement carries certain risks, especially in older women. Women who use or are thinking about using estrogen or estrogen with progestin treatments should discuss that with their caregiver. Your caregiver will help you understand the benefits and risks. The ideal dose of hormone replacement therapy is not known. The Food and Drug Administration (FDA) has concluded that hormone therapy should be used only at the lowest doses and for the shortest amount of time to reach treatment goals.  OSTEOPOROSIS Protecting Against Bone Loss and Preventing Fracture: If you use hormone therapy for prevention of bone loss (osteoporosis), the risks for bone loss must outweigh the risk of the therapy. Ask your caregiver about other medications known to be safe and effective for preventing bone loss and fractures. To guard against bone loss or fractures, the following is recommended:  If  you are less than age 50, take 1000 mg of calcium and at least 600 mg of Vitamin D per day.  If you are greater than age 50 but less than age 70, take 1200 mg of calcium and at least 600 mg of Vitamin D per day.  If you are greater than age 70, take 1200 mg of calcium and at least 800 mg of Vitamin D per day. Smoking and excessive alcohol intake increases the risk of osteoporosis. Eat foods rich in calcium and vitamin D and do weight bearing exercises several times a week as your caregiver suggests. DIABETES Diabetes Melitus: If you have Type I or Type 2 diabetes, you should keep your blood sugar under control with diet, exercise and recommended medication. Avoid too many sweets, starchy and fatty foods. Being overweight can make control more difficult. COGNITION AND MEMORY Cognition and Memory: Menopausal hormone therapy is not recommended for the prevention of cognitive disorders such as Alzheimer's disease or memory loss.  DEPRESSION  Depression may occur at any age, but is common in elderly women. The reasons may be because of physical, medical, social (loneliness), or financial problems and needs. If you are experiencing depression because of medical problems and control of symptoms, talk to your caregiver about this. Physical activity and   exercise may help with mood and sleep. Community and volunteer involvement may help your sense of value and worth. If you have depression and you feel that the problem is getting worse or becoming severe, talk to your caregiver about treatment options that are best for you. ACCIDENTS  Accidents are common and can be serious in the elderly woman. Prepare your house to prevent accidents. Eliminate throw rugs, place hand bars in the bath, shower and toilet areas. Avoid wearing high heeled shoes or walking on wet, snowy, and icy areas. Limit or stop driving if you have vision or hearing problems, or you feel you are unsteady with you movements and  reflexes. HEPATITIS C Hepatitis C is a type of viral infection affecting the liver. It is spread mainly through contact with blood from an infected person. It can be treated, but if left untreated, it can lead to severe liver damage over years. Many people who are infected do not know that the virus is in their blood. If you are a "baby-boomer", it is recommended that you have one screening test for Hepatitis C. IMMUNIZATIONS  Several immunizations are important to consider having during your senior years, including:   Tetanus, diptheria, and pertussis booster shot.  Influenza every year before the flu season begins.  Pneumonia vaccine.  Shingles vaccine.  Others as indicated based on your specific needs. Talk to your caregiver about these. Document Released: 10/17/2005 Document Revised: 08/11/2012 Document Reviewed: 06/12/2008 [employer] Patient Information 2014 [employer].  

## 2014-03-28 ENCOUNTER — Encounter: Payer: Self-pay | Admitting: Family Medicine

## 2014-03-28 ENCOUNTER — Ambulatory Visit (INDEPENDENT_AMBULATORY_CARE_PROVIDER_SITE_OTHER): Payer: PRIVATE HEALTH INSURANCE | Admitting: Family Medicine

## 2014-03-28 VITALS — BP 120/70 | HR 61 | Temp 98.6°F | Ht 60.0 in | Wt 237.8 lb

## 2014-03-28 DIAGNOSIS — L237 Allergic contact dermatitis due to plants, except food: Secondary | ICD-10-CM | POA: Insufficient documentation

## 2014-03-28 DIAGNOSIS — L255 Unspecified contact dermatitis due to plants, except food: Secondary | ICD-10-CM

## 2014-03-28 MED ORDER — PREDNISONE 10 MG PO TABS: ORAL_TABLET | ORAL | Status: DC

## 2014-03-28 MED ORDER — MOMETASONE FUROATE 0.1 % EX CREA: 1.0000 "application " | TOPICAL_CREAM | Freq: Every day | CUTANEOUS | Status: DC

## 2014-03-28 NOTE — Patient Instructions (Signed)
Take the prednisone as directed  Use the elocon cream on affected areas  Keep cool  Take benadryl or zyrtec for itch  Update if not starting to improve in a week or if worsening

## 2014-03-28 NOTE — Assessment & Plan Note (Signed)
Suspect poison ivy with erythematous vesicular rash on chest - worse on R moving to L and a few other areas pred taper Antihistamine otc prn  Elocon cream to aff areas topically  inst to keep cool Update if not starting to improve in a week or if worsening

## 2014-03-28 NOTE — Progress Notes (Signed)
Subjective:    Patient ID: Raven Gibson, female    DOB: 20-Mar-1961, 53 y.o.   MRN: [id number]  HPI Here with a rash all over  Sunday she had a couple dime size area of rash and itching  Then spread all over  Very itchy  Worse over R side of chest - none on back - but going to the other side yet   Was cleaning up the yard   Feels ok   Took some benadryl for itch Used a clear caladryl like product   Patient Active Problem List   Diagnosis Date Noted  . Severe obesity (BMI >= 40) 04/14/2013  . Colon cancer screening 04/13/2013  . Obesity 01/21/2012  . Routine general medical examination at a health care facility 01/13/2012  . LEUKOCYTOSIS 10/29/2010  . HYPERCHOLESTEROLEMIA, BORDERLINE 09/14/2007  . 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 GERD 09/14/2007  . OSTEOARTHRITIS 09/14/2007  . DEGENERATIVE DISC DISEASE 09/14/2007   Past Medical History  Diagnosis Date  . GERD (gastroesophageal reflux disease)   . Fatty liver   . Obesity   . DDD (degenerative disc disease)    Past Surgical History  Procedure Laterality Date  . Lumbar spine surgery      ruptured disk  . Cholecystectomy     History  Substance Use Topics  . Smoking status: Never Smoker   . Smokeless tobacco: Never Used  . Alcohol Use: No   Family History  Problem Relation Age of Onset  . Hypertension Mother   . Hyperlipidemia Mother   . Hypertension Father     MI x 3   Allergies  Allergen Reactions  . Codeine     REACTION: nausea and vomiting   Current Outpatient Prescriptions on File Prior to Visit  Medication Sig Dispense Refill  . Ascorbic Acid (VITAMIN C) 1000 MG tablet Take 1,000 mg by mouth daily.      Marland Kitchen NEXIUM 40 MG capsule TAKE ONE CAPSULE BY MOUTH EVERY MORNING  30 capsule  2   No current facility-administered medications on file prior to visit.    Review of Systems Review of Systems  Constitutional: Negative for fever, appetite change, fatigue and unexpected weight change.  Eyes:  Negative for pain and visual disturbance.  Respiratory: Negative for cough and shortness of breath.   Cardiovascular: Negative for cp or palpitations    Gastrointestinal: Negative for nausea, diarrhea and constipation.  Genitourinary: Negative for urgency and frequency.  Skin: Negative for pallor and pos for itchy rash  with blistering  Neurological: Negative for weakness, light-headedness, numbness and headaches.  Hematological: Negative for adenopathy. Does not bruise/bleed easily.  Psychiatric/Behavioral: Negative for dysphoric mood. The patient is not nervous/anxious.         Objective:   Physical Exam  Constitutional: She appears well-developed and well-nourished. No distress.  obese and well appearing   HENT:  Head: Normocephalic and atraumatic.  Mouth/Throat: Oropharynx is clear and moist.  Eyes: Conjunctivae and EOM are normal. Pupils are equal, round, and reactive to light. Right eye exhibits no discharge. Left eye exhibits no discharge.  Neck: Normal range of motion. Neck supple.  Cardiovascular: Normal rate and regular rhythm.   Pulmonary/Chest: Effort normal and breath sounds normal. No respiratory distress. She has no wheezes.  Musculoskeletal:  No joint changes   Lymphadenopathy:    She has no cervical adenopathy.  Neurological: She is alert.  Skin: Skin is warm and dry. Rash noted. There is erythema.  Erythematous vesicular rash in  patches on arms/legs with larger confluent area on R side of chest - and extending some to the L chest  No rash on back or abdomen  No weeping or pustules   Psychiatric: She has a normal mood and affect.          Assessment & Plan:   Problem List Items Addressed This Visit     Musculoskeletal and Integument   Plant dermatitis - Primary     Suspect poison ivy with erythematous vesicular rash on chest - worse on R moving to L and a few other areas pred taper Antihistamine otc prn  Elocon cream to aff areas topically  inst to  keep cool Update if not starting to improve in a week or if worsening

## 2014-03-28 NOTE — Progress Notes (Signed)
Pre visit review using our clinic review tool, if applicable. No additional management support is needed unless otherwise documented below in the visit note. 

## 2014-07-02 ENCOUNTER — Telehealth: Payer: Self-pay | Admitting: Family Medicine

## 2014-07-02 DIAGNOSIS — Z0000 Encounter for general adult medical examination without abnormal findings: Secondary | ICD-10-CM

## 2014-07-02 NOTE — Telephone Encounter (Signed)
Message copied by Judy PimpleWER, Naelle Diegel A on Sun Jul 02, 2014 10:45 AM ------      Message from: Alvina ChouWALSH, TERRI J      Created: Wed Jun 28, 2014  4:14 PM      Regarding: Lab orders for Monday, 10.26.15       Patient is scheduled for CPX labs, please order future labs, Thanks , Terri       ------

## 2014-07-03 ENCOUNTER — Other Ambulatory Visit (INDEPENDENT_AMBULATORY_CARE_PROVIDER_SITE_OTHER): Payer: PRIVATE HEALTH INSURANCE

## 2014-07-03 DIAGNOSIS — E785 Hyperlipidemia, unspecified: Secondary | ICD-10-CM

## 2014-07-03 DIAGNOSIS — Z0000 Encounter for general adult medical examination without abnormal findings: Secondary | ICD-10-CM

## 2014-07-03 DIAGNOSIS — D72829 Elevated white blood cell count, unspecified: Secondary | ICD-10-CM

## 2014-07-03 LAB — CBC WITH DIFFERENTIAL/PLATELET
BASOS ABS: 0 10*3/uL (ref 0.0–0.1)
Basophils Relative: 0.7 % (ref 0.0–3.0)
Eosinophils Absolute: 0.1 10*3/uL (ref 0.0–0.7)
Eosinophils Relative: 1.7 % (ref 0.0–5.0)
HCT: 41.6 % (ref 36.0–46.0)
Hemoglobin: 13.7 g/dL (ref 12.0–15.0)
LYMPHS PCT: 33.3 % (ref 12.0–46.0)
Lymphs Abs: 1.8 10*3/uL (ref 0.7–4.0)
MCHC: 32.9 g/dL (ref 30.0–36.0)
MCV: 92.2 fl (ref 78.0–100.0)
MONOS PCT: 5.1 % (ref 3.0–12.0)
Monocytes Absolute: 0.3 10*3/uL (ref 0.1–1.0)
NEUTROS PCT: 59.2 % (ref 43.0–77.0)
Neutro Abs: 3.3 10*3/uL (ref 1.4–7.7)
PLATELETS: 253 10*3/uL (ref 150.0–400.0)
RBC: 4.51 Mil/uL (ref 3.87–5.11)
RDW: 13.9 % (ref 11.5–15.5)
WBC: 5.6 10*3/uL (ref 4.0–10.5)

## 2014-07-03 LAB — COMPREHENSIVE METABOLIC PANEL
ALT: 12 U/L (ref 0–35)
AST: 18 U/L (ref 0–37)
Albumin: 3.5 g/dL (ref 3.5–5.2)
Alkaline Phosphatase: 73 U/L (ref 39–117)
BILIRUBIN TOTAL: 0.3 mg/dL (ref 0.2–1.2)
BUN: 12 mg/dL (ref 6–23)
CALCIUM: 8.9 mg/dL (ref 8.4–10.5)
CHLORIDE: 105 meq/L (ref 96–112)
CO2: 30 meq/L (ref 19–32)
Creatinine, Ser: 1 mg/dL (ref 0.4–1.2)
GFR: 64.75 mL/min (ref >60.00)
Glucose, Bld: 91 mg/dL (ref 70–99)
Potassium: 3.9 mEq/L (ref 3.5–5.1)
SODIUM: 140 meq/L (ref 135–145)
Total Protein: 7.4 g/dL (ref 6.0–8.3)

## 2014-07-03 LAB — LIPID PANEL
Cholesterol: 187 mg/dL (ref 0–200)
HDL: 55.1 mg/dL (ref >39.00)
LDL Cholesterol: 106 mg/dL — ABNORMAL HIGH (ref 0–99)
NONHDL: 131.9
Total CHOL/HDL Ratio: 3
Triglycerides: 129 mg/dL (ref 0.0–149.0)
VLDL: 25.8 mg/dL (ref 0.0–40.0)

## 2014-07-03 LAB — TSH: TSH: 1.98 u[IU]/mL (ref 0.35–4.50)

## 2014-07-10 ENCOUNTER — Ambulatory Visit (INDEPENDENT_AMBULATORY_CARE_PROVIDER_SITE_OTHER): Payer: PRIVATE HEALTH INSURANCE | Admitting: Family Medicine

## 2014-07-10 ENCOUNTER — Encounter: Payer: Self-pay | Admitting: Family Medicine

## 2014-07-10 VITALS — BP 132/72 | HR 54 | Temp 98.3°F | Ht 59.25 in | Wt 234.2 lb

## 2014-07-10 DIAGNOSIS — Z1211 Encounter for screening for malignant neoplasm of colon: Secondary | ICD-10-CM

## 2014-07-10 DIAGNOSIS — Z23 Encounter for immunization: Secondary | ICD-10-CM

## 2014-07-10 DIAGNOSIS — E669 Obesity, unspecified: Secondary | ICD-10-CM

## 2014-07-10 DIAGNOSIS — Z0000 Encounter for general adult medical examination without abnormal findings: Secondary | ICD-10-CM

## 2014-07-10 DIAGNOSIS — E785 Hyperlipidemia, unspecified: Secondary | ICD-10-CM

## 2014-07-10 NOTE — Progress Notes (Signed)
Pre visit review using our clinic review tool, if applicable. No additional management support is needed unless otherwise documented below in the visit note. 

## 2014-07-10 NOTE — Assessment & Plan Note (Signed)
Discussed how this problem influences overall health and the risks it imposes  Reviewed plan for weight loss with lower calorie diet (via better food choices and also portion control or program like weight watchers) and exercise building up to or more than 30 minutes 5 days per week including some aerobic activity   Enc more activity- this is helping her loose

## 2014-07-10 NOTE — Assessment & Plan Note (Signed)
Disc goals for lipids and reasons to control them Rev labs with pt Rev low sat fat diet in detail Pretty good control with diet - overall stable

## 2014-07-10 NOTE — Patient Instructions (Addendum)
Flu vaccine today  Don't forget to get your mammogram in December  Please do stool card for colon cancer screening  Keep active -it is helping you loose weight

## 2014-07-10 NOTE — Assessment & Plan Note (Signed)
D/w patient [id number]:options for colon cancer screening, including IFOB vs. colonoscopy.  Risks and benefits of both were discussed and patient voiced understanding.  Pt elects for: IFOB She is not ready for colonoscopy

## 2014-07-10 NOTE — Assessment & Plan Note (Signed)
Reviewed health habits including diet and exercise and skin cancer prevention Reviewed appropriate screening tests for age  Also reviewed health mt list, fam hx and immunization status , as well as social and family history   Flu vaccine today  She will see gyn in Dec and update mammogram

## 2014-07-10 NOTE — Progress Notes (Signed)
Subjective:    Patient ID: Raven HutchinsonJanice W Gibson, female    DOB: 09/15/1960, 53 y.o.   MRN: [id number]  HPI Here for health maintenance exam and to review chronic medical problems    Her back is hurting today  Stays tired a lot   Wt is down 3 lb with bmi of 46 Does not watch what she eats but has to do a lot more around the house with disabled husband    Flu shot -will get today  Pap nl 12/14  Still sees gyn - every dec   Mammogram 12/14 nl Self exam -no lumps   Td 1/10  Declines colonoscopy in the past -still not ready  IFOB  card neg 8/14  Will do that again    Hyperlipidemia Lab Results  Component Value Date   CHOL 187 07/03/2014   CHOL 183 04/06/2013   CHOL 190 01/14/2012   Lab Results  Component Value Date   HDL 55.10 07/03/2014   HDL 52.60 04/06/2013   HDL 56.40 01/14/2012   Lab Results  Component Value Date   LDLCALC 106* 07/03/2014   LDLCALC 102* 04/06/2013   LDLCALC 109* 01/14/2012   Lab Results  Component Value Date   TRIG 129.0 07/03/2014   TRIG 144.0 04/06/2013   TRIG 121.0 01/14/2012   Lab Results  Component Value Date   CHOLHDL 3 07/03/2014   CHOLHDL 3 04/06/2013   CHOLHDL 3 01/14/2012   Lab Results  Component Value Date   LDLDIRECT 130.8 09/11/2009   LDLDIRECT 124.2 09/15/2007    Stable and well controlled with diet   No change in meds nexium still works for her GERD- has to watch her diet    Patient Active Problem List   Diagnosis Date Noted  . Plant dermatitis 03/28/2014  . Severe obesity (BMI >= 40) 04/14/2013  . Colon cancer screening 04/13/2013  . Obesity 01/21/2012  . Routine general medical examination at a health care facility 01/13/2012  . LEUKOCYTOSIS 10/29/2010  . Hyperlipidemia, mild 09/14/2007  . 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 GERD 09/14/2007  . OSTEOARTHRITIS 09/14/2007  . DEGENERATIVE DISC DISEASE 09/14/2007   Past Medical History  Diagnosis Date  . GERD (gastroesophageal reflux disease)   . Fatty  liver   . Obesity   . DDD (degenerative disc disease)    Past Surgical History  Procedure Laterality Date  . Lumbar spine surgery      ruptured disk  . Cholecystectomy     History  Substance Use Topics  . Smoking status: Never Smoker   . Smokeless tobacco: Never Used  . Alcohol Use: No   Family History  Problem Relation Age of Onset  . Hypertension Mother   . Hyperlipidemia Mother   . Hypertension Father     MI x 3   Allergies  Allergen Reactions  . Codeine     REACTION: nausea and vomiting   Current Outpatient Prescriptions on File Prior to Visit  Medication Sig Dispense Refill  . Ascorbic Acid (VITAMIN C) 1000 MG tablet Take 1,000 mg by mouth daily.    Marland Kitchen. NEXIUM 40 MG capsule TAKE ONE CAPSULE BY MOUTH EVERY MORNING 30 capsule 2   No current facility-administered medications on file prior to visit.    Review of Systems     Review of Systems  Constitutional: Negative for fever, appetite change, fatigue and unexpected weight change.  Eyes: Negative for pain and visual disturbance.  Respiratory: Negative for cough and shortness of breath.  Cardiovascular: Negative for cp or palpitations    Gastrointestinal: Negative for nausea, diarrhea and constipation.  Genitourinary: Negative for urgency and frequency.  Skin: Negative for pallor or rash   MSK pos for chronic back pain  Neurological: Negative for weakness, light-headedness, numbness and headaches.  Hematological: Negative for adenopathy. Does not bruise/bleed easily.  Psychiatric/Behavioral: Negative for dysphoric mood. The patient is not nervous/anxious.      Objective:   Physical Exam  Constitutional: She appears well-developed and well-nourished. No distress.  obese and well appearing   HENT:  Head: Normocephalic and atraumatic.  Right Ear: External ear normal.  Left Ear: External ear normal.  Nose: Nose normal.  Mouth/Throat: Oropharynx is clear and moist.  Eyes: Conjunctivae and EOM are normal. Pupils  are equal, round, and reactive to light. Right eye exhibits no discharge. Left eye exhibits no discharge. No scleral icterus.  Neck: Normal range of motion. Neck supple. No JVD present. No thyromegaly present.  Cardiovascular: Normal rate, regular rhythm, normal heart sounds and intact distal pulses.  Exam reveals no gallop.   Pulmonary/Chest: Effort normal and breath sounds normal. No respiratory distress. She has no wheezes. She has no rales.  Abdominal: Soft. Bowel sounds are normal. She exhibits no distension and no mass. There is no tenderness.  Musculoskeletal: She exhibits no edema or tenderness.  Limited rom of LS  Lymphadenopathy:    She has no cervical adenopathy.  Neurological: She is alert. She has normal reflexes. No cranial nerve deficit. She exhibits normal muscle tone. Coordination normal.  Skin: Skin is warm and dry. No rash noted. No erythema. No pallor.  Psychiatric: She has a normal mood and affect.          Assessment & Plan:   Problem List Items Addressed This Visit      Other   Colon cancer screening    D/w patient [id number]:options for colon cancer screening, including IFOB vs. colonoscopy.  Risks and benefits of both were discussed and patient voiced understanding.  Pt elects for: IFOB She is not ready for colonoscopy    Relevant Orders      Fecal occult blood, imunochemical   Hyperlipidemia, mild    Disc goals for lipids and reasons to control them Rev labs with pt Rev low sat fat diet in detail Pretty good control with diet - overall stable     Obesity    Discussed how this problem influences overall health and the risks it imposes  Reviewed plan for weight loss with lower calorie diet (via better food choices and also portion control or program like weight watchers) and exercise building up to or more than 30 minutes 5 days per week including some aerobic activity   Enc more activity- this is helping her loose     Routine general medical examination at a  health care facility - Primary    Reviewed health habits including diet and exercise and skin cancer prevention Reviewed appropriate screening tests for age  Also reviewed health mt list, fam hx and immunization status , as well as social and family history   Flu vaccine today  She will see gyn in Dec and update mammogram        Other Visit Diagnoses    Need for prophylactic vaccination and inoculation against influenza        Relevant Orders       Flu Vaccine QUAD 36+ mos PF IM (Fluarix Quad PF) (Completed)

## 2014-08-21 ENCOUNTER — Other Ambulatory Visit: Payer: Self-pay

## 2014-08-21 DIAGNOSIS — Z1231 Encounter for screening mammogram for malignant neoplasm of breast: Secondary | ICD-10-CM

## 2014-09-05 ENCOUNTER — Ambulatory Visit
Admission: RE | Admit: 2014-09-05 | Discharge: 2014-09-05 | Disposition: A | Discharge status: Home / Self Care | Payer: PRIVATE HEALTH INSURANCE | Source: Ambulatory Visit

## 2014-09-05 DIAGNOSIS — Z1231 Encounter for screening mammogram for malignant neoplasm of breast: Secondary | ICD-10-CM

## 2014-09-15 ENCOUNTER — Encounter: Payer: Self-pay | Admitting: Women's Health

## 2014-09-15 ENCOUNTER — Ambulatory Visit (INDEPENDENT_AMBULATORY_CARE_PROVIDER_SITE_OTHER): Payer: PRIVATE HEALTH INSURANCE | Admitting: Women's Health

## 2014-09-15 VITALS — BP 124/88 | Ht 59.0 in | Wt 234.0 lb

## 2014-09-15 DIAGNOSIS — Z01419 Encounter for gynecological examination (general) (routine) without abnormal findings: Secondary | ICD-10-CM

## 2014-09-15 NOTE — Patient Instructions (Signed)
Health Recommendations for Postmenopausal Women Respected and ongoing research has looked at the most common causes of death, disability, and poor quality of life in postmenopausal women. The causes include heart disease, diseases of blood vessels, diabetes, depression, cancer, and bone loss (osteoporosis). Many things can be done to help lower the chances of developing these and other common problems. CARDIOVASCULAR DISEASE Heart Disease: A heart attack is a medical emergency. Know the signs and symptoms of a heart attack. Below are things women can do to reduce their risk for heart disease.   Do not smoke. If you smoke, quit.  Aim for a healthy weight. Being overweight causes many preventable deaths. Eat a healthy and balanced diet and drink an adequate amount of liquids.  Get moving. Make a commitment to be more physically active. Aim for 30 minutes of activity on most, if not all days of the week.  Eat for heart health. Choose a diet that is low in saturated fat and cholesterol and eliminate trans fat. Include whole grains, vegetables, and fruits. Read and understand the labels on food containers before buying.  Know your numbers. Ask your caregiver to check your blood pressure, cholesterol (total, HDL, LDL, triglycerides) and blood glucose. Work with your caregiver on improving your entire clinical picture.  High blood pressure. Limit or stop your table salt intake (try salt substitute and food seasonings). Avoid salty foods and drinks. Read labels on food containers before buying. Eating well and exercising can help control high blood pressure. STROKE  Stroke is a medical emergency. Stroke may be the result of a blood clot in a blood vessel in the brain or by a brain hemorrhage (bleeding). Know the signs and symptoms of a stroke. To lower the risk of developing a stroke:  Avoid fatty foods.  Quit smoking.  Control your diabetes, blood pressure, and irregular heart rate. THROMBOPHLEBITIS  (BLOOD CLOT) OF THE LEG  Becoming overweight and leading a stationary lifestyle may also contribute to developing blood clots. Controlling your diet and exercising will help lower the risk of developing blood clots. CANCER SCREENING  Breast Cancer: Take steps to reduce your risk of breast cancer.  You should practice "breast self-awareness." This means understanding the normal appearance and feel of your breasts and should include breast self-examination. Any changes detected, no matter how small, should be reported to your caregiver.  After age 40, you should have a clinical breast exam (CBE) every year.  Starting at age 40, you should consider having a mammogram (breast X-ray) every year.  If you have a family history of breast cancer, talk to your caregiver about genetic screening.  If you are at high risk for breast cancer, talk to your caregiver about having an MRI and a mammogram every year.  Intestinal or Stomach Cancer: Tests to consider are a rectal exam, fecal occult blood, sigmoidoscopy, and colonoscopy. Women who are high risk may need to be screened at an earlier age and more often.  Cervical Cancer:  Beginning at age 30, you should have a Pap test every 3 years as long as the past 3 Pap tests have been normal.  If you have had past treatment for cervical cancer or a condition that could lead to cancer, you need Pap tests and screening for cancer for at least 20 years after your treatment.  If you had a hysterectomy for a problem that was not cancer or a condition that could lead to cancer, then you no longer need Pap tests.    If you are between ages 65 and 70, and you have had normal Pap tests going back 10 years, you no longer need Pap tests.  If Pap tests have been discontinued, risk factors (such as a new sexual partner) need to be reassessed to determine if screening should be resumed.  Some medical problems can increase the chance of getting cervical cancer. In these  cases, your caregiver may recommend more frequent screening and Pap tests.  Uterine Cancer: If you have vaginal bleeding after reaching menopause, you should notify your caregiver.  Ovarian Cancer: Other than yearly pelvic exams, there are no reliable tests available to screen for ovarian cancer at this time except for yearly pelvic exams.  Lung Cancer: Yearly chest X-rays can detect lung cancer and should be done on high risk women, such as cigarette smokers and women with chronic lung disease (emphysema).  Skin Cancer: A complete body skin exam should be done at your yearly examination. Avoid overexposure to the sun and ultraviolet light lamps. Use a strong sun block cream when in the sun. All of these things are important for lowering the risk of skin cancer. MENOPAUSE Menopause Symptoms: Hormone therapy products are effective for treating symptoms associated with menopause:  Moderate to severe hot flashes.  Night sweats.  Mood swings.  Headaches.  Tiredness.  Loss of sex drive.  Insomnia.  Other symptoms. Hormone replacement carries certain risks, especially in older women. Women who use or are thinking about using estrogen or estrogen with progestin treatments should discuss that with their caregiver. Your caregiver will help you understand the benefits and risks. The ideal dose of hormone replacement therapy is not known. The Food and Drug Administration (FDA) has concluded that hormone therapy should be used only at the lowest doses and for the shortest amount of time to reach treatment goals.  OSTEOPOROSIS Protecting Against Bone Loss and Preventing Fracture If you use hormone therapy for prevention of bone loss (osteoporosis), the risks for bone loss must outweigh the risk of the therapy. Ask your caregiver about other medications known to be safe and effective for preventing bone loss and fractures. To guard against bone loss or fractures, the following is recommended:  If  you are younger than age 50, take 1000 mg of calcium and at least 600 mg of Vitamin D per day.  If you are older than age 50 but younger than age 70, take 1200 mg of calcium and at least 600 mg of Vitamin D per day.  If you are older than age 70, take 1200 mg of calcium and at least 800 mg of Vitamin D per day. Smoking and excessive alcohol intake increases the risk of osteoporosis. Eat foods rich in calcium and vitamin D and do weight bearing exercises several times a week as your caregiver suggests. DIABETES Diabetes Mellitus: If you have type I or type 2 diabetes, you should keep your blood sugar under control with diet, exercise, and recommended medication. Avoid starchy and fatty foods, and too many sweets. Being overweight can make diabetes control more difficult. COGNITION AND MEMORY Cognition and Memory: Menopausal hormone therapy is not recommended for the prevention of cognitive disorders such as Alzheimer's disease or memory loss.  DEPRESSION  Depression may occur at any age, but it is common in elderly women. This may be because of physical, medical, social (loneliness), or financial problems and needs. If you are experiencing depression because of medical problems and control of symptoms, talk to your caregiver about this. Physical   activity and exercise may help with mood and sleep. Community and volunteer involvement may improve your sense of value and worth. If you have depression and you feel that the problem is getting worse or becoming severe, talk to your caregiver about which treatment options are best for you. ACCIDENTS  Accidents are common and can be serious in elderly woman. Prepare your house to prevent accidents. Eliminate throw rugs, place hand bars in bath, shower, and toilet areas. Avoid wearing high heeled shoes or walking on wet, snowy, and icy areas. Limit or stop driving if you have vision or hearing problems, or if you feel you are unsteady with your movements and  reflexes. HEPATITIS C Hepatitis C is a type of viral infection affecting the liver. It is spread mainly through contact with blood from an infected person. It can be treated, but if left untreated, it can lead to severe liver damage over the years. Many people who are infected do not know that the virus is in their blood. If you are a "baby-boomer", it is recommended that you have one screening test for Hepatitis C. IMMUNIZATIONS  Several immunizations are important to consider having during your senior years, including:   Tetanus, diphtheria, and pertussis booster shot.  Influenza every year before the flu season begins.  Pneumonia vaccine.  Shingles vaccine.  Others, as indicated based on your specific needs. Talk to your caregiver about these. Document Released: 10/17/2005 Document Revised: 01/09/2014 Document Reviewed: 06/12/2008 [employer] Patient Information 2015 [employer]. This information is not intended to replace advice given to you by your health care provider. Make sure you discuss any questions you have with your health care provider.  

## 2014-09-15 NOTE — Progress Notes (Signed)
Raven HutchinsonJanice W Gibson 12/15/1960 [id number]    History:    Presents for annual exam.  Postmenopausal/no bleeding/no HRT. 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 Normal mammogram history. History of 6 cm fibroid/ asymptomatic. Recently had normal CMP, lipid panel, TSH, at primary care. GERD and degenerative disc disease primary care manages. Has not had a colonoscopy.  Past medical history, past surgical history, family history and social history were all reviewed and documented in the EPIC chart. Works at a plant. Parents hypertension. Husband disabled COPD/emphysema.  ROS:  A ROS was performed and pertinent positives and negatives are included.  Exam:  Filed Vitals:   09/15/14 1534  BP: 124/88    General appearance:  Normal Thyroid:  Symmetrical, normal in size, without palpable masses or nodularity. Respiratory  Auscultation:  Clear without wheezing or rhonchi Cardiovascular  Auscultation:  Regular rate, without rubs, murmurs or gallops  Edema/varicosities:  Not grossly evident Abdominal  Soft,nontender, without masses, guarding or rebound.  Liver/spleen:  No organomegaly noted  Hernia:  None appreciated  Skin  Inspection:  Grossly normal   Breasts: Examined lying and sitting.     Right: Without masses, retractions, discharge or axillary adenopathy.     Left: Without masses, retractions, discharge or axillary adenopathy. Gentitourinary   Inguinal/mons:  Normal without inguinal adenopathy  External genitalia:  Normal  BUS/Urethra/Skene's glands:  Normal  Vagina:  Normal  Cervix:  Normal  Uterus:  10 weeks' size/fibroids shape and contour.  Midline and mobile  Adnexa/parametria:     Rt: Without masses or tenderness.   Lt: Without masses or tenderness.  Anus and perineum: Normal  Digital rectal exam: Normal sphincter tone without palpated masses or tenderness  Assessment/Plan:  54 y.o. MWF G0 for annual exam with no complaints.  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 SBE's, continue annual screening mammogram, calcium rich diet, vitamin D 1000 daily encouraged. Has not had a DEXA, instructed to schedule. Home safety, fall prevention and importance of daily regular exercise reviewed. 2013 Pap normal with negative HR HPV, screening guidelines reviewed. Blood pressure slightly elevated, instructed to check away from office if continues greater than 130/80 to follow-up with primary care.  Raven ChallengerYOUNG,Raven J K[facility] Veterans Memorial HospitalWHNP, 4:29 PM 09/15/2014

## 2014-09-25 ENCOUNTER — Other Ambulatory Visit (INDEPENDENT_AMBULATORY_CARE_PROVIDER_SITE_OTHER): Payer: PRIVATE HEALTH INSURANCE

## 2014-09-25 DIAGNOSIS — Z1211 Encounter for screening for malignant neoplasm of colon: Secondary | ICD-10-CM

## 2014-09-25 LAB — FECAL OCCULT BLOOD, IMMUNOCHEMICAL: FECAL OCCULT BLD: NEGATIVE

## 2015-03-06 ENCOUNTER — Other Ambulatory Visit: Payer: Self-pay | Admitting: Family Medicine

## 2015-03-07 NOTE — Telephone Encounter (Signed)
She used it in the past prn Please refill times 1

## 2015-03-07 NOTE — Telephone Encounter (Signed)
Rx not on current med list, please advise.

## 2015-03-07 NOTE — Telephone Encounter (Signed)
done

## 2015-04-27 ENCOUNTER — Encounter: Payer: Self-pay | Admitting: Primary Care

## 2015-04-27 ENCOUNTER — Ambulatory Visit (INDEPENDENT_AMBULATORY_CARE_PROVIDER_SITE_OTHER): Payer: PRIVATE HEALTH INSURANCE | Admitting: Primary Care

## 2015-04-27 VITALS — BP 128/82 | HR 55 | Temp 98.6°F | Ht 59.0 in | Wt 231.4 lb

## 2015-04-27 DIAGNOSIS — L237 Allergic contact dermatitis due to plants, except food: Secondary | ICD-10-CM

## 2015-04-27 DIAGNOSIS — L255 Unspecified contact dermatitis due to plants, except food: Secondary | ICD-10-CM

## 2015-04-27 MED ORDER — PREDNISONE 20 MG PO TABS: ORAL_TABLET | ORAL | Status: DC

## 2015-04-27 MED ORDER — METHYLPREDNISOLONE ACETATE 80 MG/ML IJ SUSP
80.0000 mg | Freq: Once | INTRAMUSCULAR | Status: AC
  Administered 2015-04-27: 80 mg via INTRAMUSCULAR

## 2015-04-27 NOTE — Progress Notes (Signed)
   Subjective:    Patient ID: Raven Gibson, female    DOB: 1961-08-16, 54 y.o.   MRN: [id number]  HPI  Raven Gibson is a 54 year old female who presents today with a chief complaint of rash. She first noticed the rash on Sunday. She was working outside in her yard Saturday afternoon and came in contact with poison ivy. Her rash is located to to her anterior and posterior trunk, bilateral arms, and right palm. She reports itching and some tenderness. She's applied mometasone cream and been taking benadryl with temporary relief. Overall the rash has become worse.  Review of Systems  Constitutional: Negative for fever and chills.  Respiratory: Negative for shortness of breath.   Cardiovascular: Negative for chest pain.  Skin: Positive for rash.       Past Medical History  Diagnosis Date  . GERD (gastroesophageal reflux disease)   . Fatty liver   . Obesity   . DDD (degenerative disc disease)     Social History   Social History  . Marital Status: Married    Spouse Name: N/A  . Number of Children: 0  . Years of Education: N/A   Occupational History  .     Social History Main Topics  . Smoking status: Never Smoker   . Smokeless tobacco: Never Used  . Alcohol Use: No  . Drug Use: No  . Sexual Activity: No     Comment: LESS THAN 5 SECXUAL PARTNERS, UNKNOWN AGE OF FIRST INTERCOURSE   Other Topics Concern  . Not on file   Social History Narrative    Past Surgical History  Procedure Laterality Date  . Lumbar spine surgery      ruptured disk  . Cholecystectomy      Family History  Problem Relation Age of Onset  . Hypertension Mother   . Hyperlipidemia Mother   . Hypertension Father     MI x 3    Allergies  Allergen Reactions  . Codeine     REACTION: nausea and vomiting    Current Outpatient Prescriptions on File Prior to Visit  Medication Sig Dispense Refill  . Ascorbic Acid (VITAMIN C) 1000 MG tablet Take 1,000 mg by mouth daily.    . Glucosamine-Chondroitin  (MOVE FREE PO) Take 1 tablet by mouth daily.    . mometasone (ELOCON) 0.1 % cream APPLY TO AFFECTED AREA(S) ONCE DAILY AS DIRECTED 45 g 0  . Naproxen Sodium (ALEVE PO) Take by mouth as needed.    Marland Kitchen NEXIUM 40 MG capsule TAKE ONE CAPSULE BY MOUTH EVERY MORNING 30 capsule 2   No current facility-administered medications on file prior to visit.    BP 128/82 mmHg  Pulse 55  Temp(Src) 98.6 F (37 C) (Oral)  Ht  (1.499 m)  Wt 231 lb 6.4 oz (104.962 kg)  BMI 46.71 kg/m2  SpO2 99%  LMP 02/19/2012     Objective:   Physical Exam  Constitutional: She appears well-nourished.  Cardiovascular: Normal rate and regular rhythm.   Pulmonary/Chest: Effort normal and breath sounds normal.  Skin: Skin is warm and dry.  Moderate rash representing poison ivy dermatitis present to anterior trunk with small spot on posterior trunk, minimal rash to bilateral arms, right anterior hand.          Assessment & Plan:

## 2015-04-27 NOTE — Progress Notes (Signed)
Pre visit review using our clinic review tool, if applicable. No additional management support is needed unless otherwise documented below in the visit note. 

## 2015-04-27 NOTE — Assessment & Plan Note (Signed)
Moderate poison ivy dermatitis to anterior trunk, more mild to bilateral upper extremities and posterior trunk. Present since contact with poison ivy in her yard. Treat with IM depo medrol today. RX for prednisone taper to begin tomorrow. Apply mometasone cream PRN.  Follow up PRN.

## 2015-04-27 NOTE — Patient Instructions (Signed)
You've been provided with a steroid injection today.  Start prednisone Saturday. Take 3 tablets daily for 4 days, then 2 tablets daily for 4 days, then 1 tablet daily for 4 days.  Continue using your cream.  Follow up if no improvement once the prednisone is complete.  It was a pleasure meeting you!  Poison [employer] ivy is a inflammation of the skin (contact dermatitis) caused by touching the allergens on the leaves of the ivy plant following previous exposure to the plant. The rash usually appears 48 hours after exposure. The rash is usually bumps (papules) or blisters (vesicles) in a linear pattern. Depending on your own sensitivity, the rash may simply cause redness and itching, or it may also progress to blisters which may break open. These must be well cared for to prevent secondary bacterial (germ) infection, followed by scarring. Keep any open areas dry, clean, dressed, and covered with an antibacterial ointment if needed. The eyes may also get puffy. The puffiness is worst in the morning and gets better as the day progresses. This dermatitis usually heals without scarring, within 2 to 3 weeks without treatment. HOME CARE INSTRUCTIONS  Thoroughly wash with soap and water as soon as you have been exposed to poison ivy. You have about one half hour to remove the plant resin before it will cause the rash. This washing will destroy the oil or antigen on the skin that is causing, or will cause, the rash. Be sure to wash under your fingernails as any plant resin there will continue to spread the rash. Do not rub skin vigorously when washing affected area. Poison ivy cannot spread if no oil from the plant remains on your body. A rash that has progressed to weeping sores will not spread the rash unless you have not washed thoroughly. It is also important to wash any clothes you have been wearing as these may carry active allergens. The rash will return if you wear the unwashed clothing, even several  days later. Avoidance of the plant in the future is the best measure. Poison ivy plant can be recognized by the number of leaves. Generally, poison ivy has three leaves with flowering branches on a single stem. Diphenhydramine may be purchased over the counter and used as needed for itching. Do not drive with this medication if it makes you drowsy.Ask your caregiver about medication for children. SEEK MEDICAL CARE IF:  Open sores develop.  Redness spreads beyond area of rash.  You notice purulent (pus-like) discharge.  You have increased pain.  Other signs of infection develop (such as fever). Document Released: 08/22/2000 Document Revised: 11/17/2011 Document Reviewed: 02/02/2009 [facility] Patient Information 2015 [place], [place]. This information is not intended to replace advice given to you by your health care provider. Make sure you discuss any questions you have with your health care provider.

## 2015-07-27 ENCOUNTER — Ambulatory Visit (INDEPENDENT_AMBULATORY_CARE_PROVIDER_SITE_OTHER): Payer: PRIVATE HEALTH INSURANCE

## 2015-07-27 DIAGNOSIS — Z23 Encounter for immunization: Secondary | ICD-10-CM | POA: Diagnosis not present

## 2015-11-27 ENCOUNTER — Other Ambulatory Visit: Payer: Self-pay

## 2015-11-27 DIAGNOSIS — Z1231 Encounter for screening mammogram for malignant neoplasm of breast: Secondary | ICD-10-CM

## 2015-12-21 ENCOUNTER — Other Ambulatory Visit: Payer: PRIVATE HEALTH INSURANCE

## 2015-12-25 ENCOUNTER — Encounter: Payer: PRIVATE HEALTH INSURANCE | Admitting: Family Medicine

## 2015-12-30 ENCOUNTER — Telehealth: Payer: Self-pay | Admitting: Family Medicine

## 2015-12-30 DIAGNOSIS — Z0000 Encounter for general adult medical examination without abnormal findings: Secondary | ICD-10-CM

## 2015-12-30 NOTE — Telephone Encounter (Signed)
-----   Message from Alvina Chouerri J Walsh sent at 12/27/2015 10:38 AM EDT ----- Regarding: Lab orders for Friday, 4.28.17 Patient is scheduled for CPX labs, please order future labs, Thanks , Camelia Engerri

## 2015-12-31 ENCOUNTER — Ambulatory Visit
Admission: RE | Admit: 2015-12-31 | Discharge: 2015-12-31 | Disposition: A | Discharge status: Home / Self Care | Payer: PRIVATE HEALTH INSURANCE | Source: Ambulatory Visit

## 2015-12-31 DIAGNOSIS — Z1231 Encounter for screening mammogram for malignant neoplasm of breast: Secondary | ICD-10-CM

## 2016-01-04 ENCOUNTER — Other Ambulatory Visit (INDEPENDENT_AMBULATORY_CARE_PROVIDER_SITE_OTHER): Payer: PRIVATE HEALTH INSURANCE

## 2016-01-04 DIAGNOSIS — Z0000 Encounter for general adult medical examination without abnormal findings: Secondary | ICD-10-CM | POA: Diagnosis not present

## 2016-01-04 LAB — CBC WITH DIFFERENTIAL/PLATELET
BASOS PCT: 1 % (ref 0.0–3.0)
Basophils Absolute: 0.1 10*3/uL (ref 0.0–0.1)
Eosinophils Absolute: 0.1 10*3/uL (ref 0.0–0.7)
Eosinophils Relative: 1.8 % (ref 0.0–5.0)
HEMATOCRIT: 40.4 % (ref 36.0–46.0)
Hemoglobin: 13.6 g/dL (ref 12.0–15.0)
LYMPHS ABS: 2 10*3/uL (ref 0.7–4.0)
LYMPHS PCT: 36.1 % (ref 12.0–46.0)
MCHC: 33.8 g/dL (ref 30.0–36.0)
MCV: 92.3 fl (ref 78.0–100.0)
MONOS PCT: 5.6 % (ref 3.0–12.0)
Monocytes Absolute: 0.3 10*3/uL (ref 0.1–1.0)
NEUTROS ABS: 3 10*3/uL (ref 1.4–7.7)
NEUTROS PCT: 55.5 % (ref 43.0–77.0)
PLATELETS: 267 10*3/uL (ref 150.0–400.0)
RBC: 4.38 Mil/uL (ref 3.87–5.11)
RDW: 13.8 % (ref 11.5–15.5)
WBC: 5.5 10*3/uL (ref 4.0–10.5)

## 2016-01-04 LAB — LIPID PANEL
CHOL/HDL RATIO: 3
Cholesterol: 204 mg/dL — ABNORMAL HIGH (ref 0–200)
HDL: 61.1 mg/dL (ref >39.00)
LDL CALC: 107 mg/dL — AB (ref 0–99)
NonHDL: 143.39
TRIGLYCERIDES: 183 mg/dL — AB (ref 0.0–149.0)
VLDL: 36.6 mg/dL (ref 0.0–40.0)

## 2016-01-04 LAB — COMPREHENSIVE METABOLIC PANEL
ALT: 14 U/L (ref 0–35)
AST: 16 U/L (ref 0–37)
Albumin: 4 g/dL (ref 3.5–5.2)
Alkaline Phosphatase: 62 U/L (ref 39–117)
BILIRUBIN TOTAL: 0.4 mg/dL (ref 0.2–1.2)
BUN: 12 mg/dL (ref 6–23)
CALCIUM: 9.4 mg/dL (ref 8.4–10.5)
CHLORIDE: 105 meq/L (ref 96–112)
CO2: 30 meq/L (ref 19–32)
Creatinine, Ser: 0.93 mg/dL (ref 0.40–1.20)
GFR: 66.78 mL/min (ref >60.00)
Glucose, Bld: 96 mg/dL (ref 70–99)
POTASSIUM: 4.2 meq/L (ref 3.5–5.1)
Sodium: 140 mEq/L (ref 135–145)
Total Protein: 7 g/dL (ref 6.0–8.3)

## 2016-01-04 LAB — TSH: TSH: 2.47 u[IU]/mL (ref 0.35–4.50)

## 2016-01-08 ENCOUNTER — Encounter: Payer: Self-pay | Admitting: Family Medicine

## 2016-01-08 ENCOUNTER — Ambulatory Visit (INDEPENDENT_AMBULATORY_CARE_PROVIDER_SITE_OTHER): Payer: PRIVATE HEALTH INSURANCE | Admitting: Family Medicine

## 2016-01-08 VITALS — BP 124/68 | HR 66 | Temp 98.0°F | Ht 58.5 in | Wt 233.2 lb

## 2016-01-08 DIAGNOSIS — Z0000 Encounter for general adult medical examination without abnormal findings: Secondary | ICD-10-CM | POA: Diagnosis not present

## 2016-01-08 DIAGNOSIS — E785 Hyperlipidemia, unspecified: Secondary | ICD-10-CM

## 2016-01-08 DIAGNOSIS — Z1211 Encounter for screening for malignant neoplasm of colon: Secondary | ICD-10-CM | POA: Diagnosis not present

## 2016-01-08 NOTE — Assessment & Plan Note (Signed)
Discussed how this problem influences overall health and the risks it imposes  Reviewed plan for weight loss with lower calorie diet (via better food choices and also portion control or program like weight watchers) and exercise building up to or more than 30 minutes 5 days per week including some aerobic activity    

## 2016-01-08 NOTE — Assessment & Plan Note (Signed)
Given ifob kit She declines colonoscopy Also info on cologuard - she will check on coverage and let us know- if it is affordable may consider for next screening

## 2016-01-08 NOTE — Patient Instructions (Signed)
Take care of yourself  Try to eat a healthy diet and stay active  Weight loss would help your joint pain  See your gyn - you are may be due for a pap test  Labs are stable

## 2016-01-08 NOTE — Progress Notes (Signed)
Pre visit review using our clinic review tool, if applicable. No additional management support is needed unless otherwise documented below in the visit note. 

## 2016-01-08 NOTE — Assessment & Plan Note (Signed)
Reviewed health habits including diet and exercise and skin cancer prevention Reviewed appropriate screening tests for age  Also reviewed health mt list, fam hx and immunization status , as well as social and family history    Labs reviewed See HPI Take care of yourself  Try to eat a healthy diet and stay active  Weight loss would help your joint pain  See your gyn - you are may be due for a pap test  Labs are stable

## 2016-01-08 NOTE — Progress Notes (Signed)
Subjective:    Patient ID: Raven Gibson, female    DOB: 1960/10/04, 55 y.o.   MRN: [id number]  HPI Here for health maintenance exam and to review chronic medical problems    Doing ok /feeling all right  Feels aches and pains from aging   Wt is up 2 lb with bmi of 82 Weighs herself at work - goes up and down a few pounds  Less in the summer doing yard work  Morbid obese range  Does walk a lot / stays active Eats healthy some days / convenience eating other days   Screen for Hep C and HIV -not interested /declines, not high risk  Flu shot 11/16  Colon cancer screening -not interested in a colonoscopy  No family hx of  Colon cancer  ifob neg 1/16 Is interested in another stook kit   Pap 12/14 with gyn -nl with neg HPV Still sees gyn  Has not made her appt yet  Is in menopause -no periods (none in 2 years)  Has not noticed much re: symptoms   Mm 4/17 nl  -negative Self breast exam-no lumps or changes   Td 1/10  Cholesterol Lab Results  Component Value Date   CHOL 204* 01/04/2016   CHOL 187 07/03/2014   CHOL 183 04/06/2013   Lab Results  Component Value Date   HDL 61.10 01/04/2016   HDL 55.10 07/03/2014   HDL 52.60 04/06/2013   Lab Results  Component Value Date   LDLCALC 107* 01/04/2016   LDLCALC 106* 07/03/2014   LDLCALC 102* 04/06/2013   Lab Results  Component Value Date   TRIG 183.0* 01/04/2016   TRIG 129.0 07/03/2014   TRIG 144.0 04/06/2013   Lab Results  Component Value Date   CHOLHDL 3 01/04/2016   CHOLHDL 3 07/03/2014   CHOLHDL 3 04/06/2013   Lab Results  Component Value Date   LDLDIRECT 130.8 09/11/2009   LDLDIRECT 124.2 09/15/2007   Overall stable  HDL is good/ protective  Avoids greasy foods  Does not eat out a lot     Labs  Results for orders placed or performed in visit on 01/04/16  CBC with Differential/Platelet  Result Value Ref Range   WBC 5.5 4.0 - 10.5 K/uL   RBC 4.38 3.87 - 5.11 Mil/uL   Hemoglobin 13.6 12.0 - 15.0  g/dL   HCT 40.4 36.0 - 46.0 %   MCV 92.3 78.0 - 100.0 fl   MCHC 33.8 30.0 - 36.0 g/dL   RDW 13.8 11.5 - 15.5 %   Platelets 267.0 150.0 - 400.0 K/uL   Neutrophils Relative % 55.5 43.0 - 77.0 %   Lymphocytes Relative 36.1 12.0 - 46.0 %   Monocytes Relative 5.6 3.0 - 12.0 %   Eosinophils Relative 1.8 0.0 - 5.0 %   Basophils Relative 1.0 0.0 - 3.0 %   Neutro Abs 3.0 1.4 - 7.7 K/uL   Lymphs Abs 2.0 0.7 - 4.0 K/uL   Monocytes Absolute 0.3 0.1 - 1.0 K/uL   Eosinophils Absolute 0.1 0.0 - 0.7 K/uL   Basophils Absolute 0.1 0.0 - 0.1 K/uL  Comprehensive metabolic panel  Result Value Ref Range   Sodium 140 135 - 145 mEq/L   Potassium 4.2 3.5 - 5.1 mEq/L   Chloride 105 96 - 112 mEq/L   CO2 30 19 - 32 mEq/L   Glucose, Bld 96 70 - 99 mg/dL   BUN 12 6 - 23 mg/dL   Creatinine, Ser 0.93 0.40 -  1.20 mg/dL   Total Bilirubin 0.4 0.2 - 1.2 mg/dL   Alkaline Phosphatase 62 39 - 117 U/L   AST 16 0 - 37 U/L   ALT 14 0 - 35 U/L   Total Protein 7.0 6.0 - 8.3 g/dL   Albumin 4.0 3.5 - 5.2 g/dL   Calcium 9.4 8.4 - 10.5 mg/dL   GFR 66.78 >60.00 mL/min  Lipid panel  Result Value Ref Range   Cholesterol 204 (H) 0 - 200 mg/dL   Triglycerides 183.0 (H) 0.0 - 149.0 mg/dL   HDL 61.10 >39.00 mg/dL   VLDL 36.6 0.0 - 40.0 mg/dL   LDL Cholesterol 107 (H) 0 - 99 mg/dL   Total CHOL/HDL Ratio 3    NonHDL 143.39   TSH  Result Value Ref Range   TSH 2.47 0.35 - 4.50 uIU/mL     Patient Active Problem List   Diagnosis Date Noted  . Severe obesity (BMI >= 40) ([place]) 04/14/2013  . Colon cancer screening 04/13/2013  . Routine general medical examination at a health care facility 01/13/2012  . LEUKOCYTOSIS 10/29/2010  . Hyperlipidemia, mild 09/14/2007  . 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 GERD 09/14/2007  . OSTEOARTHRITIS 09/14/2007  . [place] DISEASE 09/14/2007   Past Medical History  Diagnosis Date  . GERD (gastroesophageal reflux disease)   . Fatty liver   . Obesity   . DDD (degenerative  disc disease)    Past Surgical History  Procedure Laterality Date  . Lumbar spine surgery      ruptured disk  . Cholecystectomy     Social History  Substance Use Topics  . Smoking status: Never Smoker   . Smokeless tobacco: Never Used  . Alcohol Use: No   Family History  Problem Relation Age of Onset  . Hypertension Mother   . Hyperlipidemia Mother   . Hypertension Father     MI x 3   Allergies  Allergen Reactions  . Codeine     REACTION: nausea and vomiting   Current Outpatient Prescriptions on File Prior to Visit  Medication Sig Dispense Refill  . Ascorbic Acid (VITAMIN C) 1000 MG tablet Take 1,000 mg by mouth daily.    . Glucosamine-Chondroitin (MOVE FREE PO) Take 1 tablet by mouth daily.    . mometasone (ELOCON) 0.1 % cream APPLY TO AFFECTED AREA(S) ONCE DAILY AS DIRECTED 45 g 0  . Naproxen Sodium (ALEVE PO) Take by mouth as needed.    Marland Kitchen NEXIUM 40 MG capsule TAKE ONE CAPSULE BY MOUTH EVERY MORNING 30 capsule 2   No current facility-administered medications on file prior to visit.       Review of Systems Review of Systems  Constitutional: Negative for fever, appetite change, fatigue and unexpected weight change.  Eyes: Negative for pain and visual disturbance.  Respiratory: Negative for cough and shortness of breath.   Cardiovascular: Negative for cp or palpitations    Gastrointestinal: Negative for nausea, diarrhea and constipation.  Genitourinary: Negative for urgency and frequency.  Skin: Negative for pallor or rash   MSK pos for joint stiffness and aches and pains, neg for joint swelling Neurological: Negative for weakness, light-headedness, numbness and headaches.  Hematological: Negative for adenopathy. Does not bruise/bleed easily.  Psychiatric/Behavioral: Negative for dysphoric mood. The patient is not nervous/anxious.         Objective:   Physical Exam  Constitutional: She appears well-developed and well-nourished. No distress.  Morbidly obese and  well appearing   HENT:  Head: Normocephalic  and atraumatic.  Right Ear: External ear normal.  Left Ear: External ear normal.  Nose: Nose normal.  Mouth/Throat: Oropharynx is clear and moist.  Eyes: Conjunctivae and EOM are normal. Pupils are equal, round, and reactive to light. Right eye exhibits no discharge. Left eye exhibits no discharge. No scleral icterus.  Neck: Normal range of motion. Neck supple. No JVD present. Carotid bruit is not present. No thyromegaly present.  Cardiovascular: Normal rate, regular rhythm, normal heart sounds and intact distal pulses.  Exam reveals no gallop.   Pulmonary/Chest: Effort normal and breath sounds normal. No respiratory distress. She has no wheezes. She has no rales.  Abdominal: Soft. Bowel sounds are normal. She exhibits no distension and no mass. There is no tenderness.  Genitourinary:  Breast and pelvic def for her visit with gyn  Musculoskeletal: She exhibits no edema or tenderness.  Lymphadenopathy:    She has no cervical adenopathy.  Neurological: She is alert. She has normal reflexes. No cranial nerve deficit. She exhibits normal muscle tone. Coordination normal.  Skin: Skin is warm and dry. No rash noted. No erythema. No pallor.  Psychiatric: She has a normal mood and affect.          Assessment & Plan:   Problem List Items Addressed This Visit      Other   Colon cancer screening    Given ifob kit She declines colonoscopy Also info on cologuard - she will check on coverage and let us know- if it is affordable may consider for next screening       Hyperlipidemia, mild    Disc goals for lipids and reasons to control them Rev labs with pt Rev low sat fat diet in detail HDL is high Enc healthy diet and watching sugar as well as fat in diet for triglycerides      Routine general medical examination at a health care facility    Reviewed health habits including diet and exercise and skin cancer prevention Reviewed appropriate  screening tests for age  Also reviewed health mt list, fam hx and immunization status , as well as social and family history    Labs reviewed See HPI Take care of yourself  Try to eat a healthy diet and stay active  Weight loss would help your joint pain  See your gyn - you are may be due for a pap test  Labs are stable       Severe obesity (BMI >= 40) ([place]) - Primary    Discussed how this problem influences overall health and the risks it imposes  Reviewed plan for weight loss with lower calorie diet (via better food choices and also portion control or program like weight watchers) and exercise building up to or more than 30 minutes 5 days per week including some aerobic activity

## 2016-01-08 NOTE — Assessment & Plan Note (Signed)
Disc goals for lipids and reasons to control them Rev labs with pt Rev low sat fat diet in detail HDL is high Enc healthy diet and watching sugar as well as fat in diet for triglycerides

## 2016-07-11 ENCOUNTER — Ambulatory Visit (INDEPENDENT_AMBULATORY_CARE_PROVIDER_SITE_OTHER): Payer: PRIVATE HEALTH INSURANCE

## 2016-07-11 DIAGNOSIS — Z23 Encounter for immunization: Secondary | ICD-10-CM | POA: Diagnosis not present

## 2016-09-04 ENCOUNTER — Ambulatory Visit (INDEPENDENT_AMBULATORY_CARE_PROVIDER_SITE_OTHER): Payer: PRIVATE HEALTH INSURANCE | Admitting: Women's Health

## 2016-09-04 ENCOUNTER — Encounter: Payer: Self-pay | Admitting: Women's Health

## 2016-09-04 VITALS — BP 122/83 | Ht 59.0 in | Wt 225.4 lb

## 2016-09-04 DIAGNOSIS — Z01419 Encounter for gynecological examination (general) (routine) without abnormal findings: Secondary | ICD-10-CM | POA: Diagnosis not present

## 2016-09-04 DIAGNOSIS — Z1151 Encounter for screening for human papillomavirus (HPV): Secondary | ICD-10-CM | POA: Diagnosis not present

## 2016-09-04 NOTE — Patient Instructions (Signed)
Colonoscopy  [phone]  Dr Carlean Purl at [facility] Introduction Menopause is a normal process in which your reproductive ability comes to an end. This process happens gradually over a span of months to years, usually between the ages of 71 and 67. Menopause is complete when you have missed 12 consecutive menstrual periods. It is important to talk with your health care provider about some of the most common conditions that affect postmenopausal women, such as heart disease, cancer, and bone loss (osteoporosis). Adopting a healthy lifestyle and getting preventive care can help to promote your health and wellness. Those actions can also lower your chances of developing some of these common conditions. What should I know about menopause? During menopause, you may experience a number of symptoms, such as:  Moderate-to-severe hot flashes.  Night sweats.  Decrease in sex drive.  Mood swings.  Headaches.  Tiredness.  Irritability.  Memory problems.  Insomnia. Choosing to treat or not to treat menopausal changes is an individual decision that you make with your health care provider. What should I know about hormone replacement therapy and supplements? Hormone therapy products are effective for treating symptoms that are associated with menopause, such as hot flashes and night sweats. Hormone replacement carries certain risks, especially as you become older. If you are thinking about using estrogen or estrogen with progestin treatments, discuss the benefits and risks with your health care provider. What should I know about heart disease and stroke? Heart disease, heart attack, and stroke become more likely as you age. This may be due, in part, to the hormonal changes that your body experiences during menopause. These can affect how your body processes dietary fats, triglycerides, and cholesterol. Heart attack and stroke are both medical  emergencies. There are many things that you can do to help prevent heart disease and stroke:  Have your blood pressure checked at least every 1-2 years. High blood pressure causes heart disease and increases the risk of stroke.  If you are 35-4 years old, ask your health care provider if you should take aspirin to prevent a heart attack or a stroke.  Do not use any tobacco products, including cigarettes, chewing tobacco, or electronic cigarettes. If you need help quitting, ask your health care provider.  It is important to eat a healthy diet and maintain a healthy weight.  Be sure to include plenty of vegetables, fruits, low-fat dairy products, and lean protein.  Avoid eating foods that are high in solid fats, added sugars, or salt (sodium).  Get regular exercise. This is one of the most important things that you can do for your health.  Try to exercise for at least 150 minutes each week. The type of exercise that you do should increase your heart rate and make you sweat. This is known as moderate-intensity exercise.  Try to do strengthening exercises at least twice each week. Do these in addition to the moderate-intensity exercise.  Know your numbers.Ask your health care provider to check your cholesterol and your blood glucose. Continue to have your blood tested as directed by your health care provider. What should I know about cancer screening? There are several types of cancer. Take the following steps to reduce your risk and to catch any cancer development as early as possible. Breast Cancer  Practice breast self-awareness.  This means understanding how your breasts normally appear and feel.  It also means doing regular breast self-exams. Let your health care provider know about any changes,  no matter how small.  If you are 92 or older, have a clinician do a breast exam (clinical breast exam or CBE) every year. Depending on your age, family history, and medical history, it may  be recommended that you also have a yearly breast X-ray (mammogram).  If you have a family history of breast cancer, talk with your health care provider about genetic screening.  If you are at high risk for breast cancer, talk with your health care provider about having an MRI and a mammogram every year.  Breast cancer (BRCA) gene test is recommended for women who have family members with BRCA-related cancers. Results of the assessment will determine the need for genetic counseling and BRCA1 and for BRCA2 testing. BRCA-related cancers include these types:  Breast. This occurs in males or females.  Ovarian.  Tubal. This may also be called fallopian tube cancer.  Cancer of the abdominal or pelvic lining (peritoneal cancer).  Prostate.  Pancreatic. Cervical, Uterine, and Ovarian Cancer  Your health care provider may recommend that you be screened regularly for cancer of the pelvic organs. These include your ovaries, uterus, and vagina. This screening involves a pelvic exam, which includes checking for microscopic changes to the surface of your cervix (Pap test).  For women ages 21-65, health care providers may recommend a pelvic exam and a Pap test every three years. For women ages 70-65, they may recommend the Pap test and pelvic exam, combined with testing for human papilloma virus (HPV), every five years. Some types of HPV increase your risk of cervical cancer. Testing for HPV may also be done on women of any age who have unclear Pap test results.  Other health care providers may not recommend any screening for nonpregnant women who are considered low risk for pelvic cancer and have no symptoms. Ask your health care provider if a screening pelvic exam is right for you.  If you have had past treatment for cervical cancer or a condition that could lead to cancer, you need Pap tests and screening for cancer for at least 20 years after your treatment. If Pap tests have been discontinued for  you, your risk factors (such as having a new sexual partner) need to be reassessed to determine if you should start having screenings again. Some women have medical problems that increase the chance of getting cervical cancer. In these cases, your health care provider may recommend that you have screening and Pap tests more often.  If you have a family history of uterine cancer or ovarian cancer, talk with your health care provider about genetic screening.  If you have vaginal bleeding after reaching menopause, tell your health care provider.  There are currently no reliable tests available to screen for ovarian cancer. Lung Cancer  Lung cancer screening is recommended for adults 4-92 years old who are at high risk for lung cancer because of a history of smoking. A yearly low-dose CT scan of the lungs is recommended if you:  Currently smoke.  Have a history of at least 30 pack-years of smoking and you currently smoke or have quit within the past 15 years. A pack-year is smoking an average of one pack of cigarettes per day for one year. Yearly screening should:  Continue until it has been 15 years since you quit.  Stop if you develop a health problem that would prevent you from having lung cancer treatment. Colorectal Cancer  This type of cancer can be detected and can often be prevented.  Routine colorectal cancer screening usually begins at age 39 and continues through age 76.  If you have risk factors for colon cancer, your health care provider may recommend that you be screened at an earlier age.  If you have a family history of colorectal cancer, talk with your health care provider about genetic screening.  Your health care provider may also recommend using home test kits to check for hidden blood in your stool.  A small camera at the end of a tube can be used to examine your colon directly (sigmoidoscopy or colonoscopy). This is done to check for the earliest forms of colorectal  cancer.  Direct examination of the colon should be repeated every 5-10 years until age 20. However, if early forms of precancerous polyps or small growths are found or if you have a family history or genetic risk for colorectal cancer, you may need to be screened more often. Skin Cancer  Check your skin from head to toe regularly.  Monitor any moles. Be sure to tell your health care provider:  About any new moles or changes in moles, especially if there is a change in a mole's shape or color.  If you have a mole that is larger than the size of a pencil eraser.  If any of your family members has a history of skin cancer, especially at a Miquel Lamson age, talk with your health care provider about genetic screening.  Always use sunscreen. Apply sunscreen liberally and repeatedly throughout the day.  Whenever you are outside, protect yourself by wearing long sleeves, pants, a wide-brimmed hat, and sunglasses. What should I know about osteoporosis? Osteoporosis is a condition in which bone destruction happens more quickly than new bone creation. After menopause, you may be at an increased risk for osteoporosis. To help prevent osteoporosis or the bone fractures that can happen because of osteoporosis, the following is recommended:  If you are 30-76 years old, get at least 1,000 mg of calcium and at least 600 mg of vitamin D per day.  If you are older than age 71 but younger than age 28, get at least 1,200 mg of calcium and at least 600 mg of vitamin D per day.  If you are older than age 72, get at least 1,200 mg of calcium and at least 800 mg of vitamin D per day. Smoking and excessive alcohol intake increase the risk of osteoporosis. Eat foods that are rich in calcium and vitamin D, and do weight-bearing exercises several times each week as directed by your health care provider. What should I know about how menopause affects my mental health? Depression may occur at any age, but it is more common as  you become older. Common symptoms of depression include:  Low or sad mood.  Changes in sleep patterns.  Changes in appetite or eating patterns.  Feeling an overall lack of motivation or enjoyment of activities that you previously enjoyed.  Frequent crying spells. Talk with your health care provider if you think that you are experiencing depression. What should I know about immunizations? It is important that you get and maintain your immunizations. These include:  Tetanus, diphtheria, and pertussis (Tdap) booster vaccine.  Influenza every year before the flu season begins.  Pneumonia vaccine.  Shingles vaccine. Your health care provider may also recommend other immunizations. This information is not intended to replace advice given to you by your health care provider. Make sure you discuss any questions you have with your health care provider. Document  Released: 10/17/2005 Document Revised: 03/14/2016 Document Reviewed: 05/29/2015  2017 Elsevier

## 2016-09-04 NOTE — Progress Notes (Signed)
Raven HutchinsonJanice W Gibson 03/01/1961 [id number]    History:    Presents for annual exam. Postmenopausal on no HRT with no bleeding. History of a 6 cm asymptomatic fibroid. 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 Normal mammogram history. Has not had a screening colonoscopy. GERD primary care manages, history of chronic low back pain degenerative disc disease   Past medical history, past surgical history, family history and social history were all reviewed and documented in the EPIC chart. Husband disabled with COPD. Parents hypertension.  ROS:  A ROS was performed and pertinent positives and negatives are included.  Exam:  Vitals:   09/04/16 1032  BP: 122/83  Weight: 225 lb 6.4 oz (102.2 kg)  Height: 4\' 11"  (1.499 m)   Body mass index is 45.53 kg/m.   General appearance:  Normal Thyroid:  Symmetrical, normal in size, without palpable masses or nodularity. Respiratory  Auscultation:  Clear without wheezing or rhonchi Cardiovascular  Auscultation:  Regular rate, without rubs, murmurs or gallops  Edema/varicosities:  Not grossly evident Abdominal  Soft,nontender, without masses, guarding or rebound.  Liver/spleen:  No organomegaly noted  Hernia:  None appreciated  Skin  Inspection:  Grossly normal   Breasts: Examined lying and sitting.     Right: Without masses, retractions, discharge or axillary adenopathy.     Left: Without masses, retractions, discharge or axillary adenopathy. Gentitourinary   Inguinal/mons:  Normal without inguinal adenopathy  External genitalia:  Normal  BUS/Urethra/Skene's glands:  Normal  Vagina:  Normal  Cervix:  Normal  Uterus:   normal in size, shape and contour.  Midline and mobile  Adnexa/parametria:     Rt: Without masses or tenderness.   Lt: Without masses or tenderness.  Anus and perineum: Normal  Digital rectal exam: Normal sphincter tone without palpated masses or tenderness  Assessment/Plan:  55 y.o. M WF G0  for annual exam with no  complaints.  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 Reviewed importance of weightbearing exercise, home safety, fall prevention discussed. Schedule DEXA. Reviewed importance of screening colonoscopy, [facility] information given instructed to schedule. UA, Pap with HR HPV typing, new screening guidelines reviewed.  Harrington ChallengerYOUNG,Jazzmine Kleiman J Eye Surgery Center LLCWHNP, 10:53 AM 09/04/2016

## 2016-09-04 NOTE — Addendum Note (Signed)
Addended by: Aura CampsWEBB, Akosua Constantine L on: 09/04/2016 11:14 AM   Modules accepted: Orders

## 2016-09-05 LAB — PAP, TP IMAGING W/ HPV RNA, RFLX HPV TYPE 16,18/45: HPV mRNA, High Risk: NOT DETECTED

## 2016-09-05 LAB — URINALYSIS W MICROSCOPIC + REFLEX CULTURE

## 2017-06-14 ENCOUNTER — Encounter: Payer: Self-pay | Admitting: Family Medicine

## 2017-06-19 ENCOUNTER — Other Ambulatory Visit (INDEPENDENT_AMBULATORY_CARE_PROVIDER_SITE_OTHER): Payer: PRIVATE HEALTH INSURANCE

## 2017-06-19 ENCOUNTER — Telehealth: Payer: Self-pay | Admitting: Family Medicine

## 2017-06-19 DIAGNOSIS — E785 Hyperlipidemia, unspecified: Secondary | ICD-10-CM

## 2017-06-19 DIAGNOSIS — Z0000 Encounter for general adult medical examination without abnormal findings: Secondary | ICD-10-CM | POA: Diagnosis not present

## 2017-06-19 LAB — CBC WITH DIFFERENTIAL/PLATELET
Basophils Absolute: 0.1 10*3/uL (ref 0.0–0.1)
Basophils Relative: 1 % (ref 0.0–3.0)
EOS PCT: 1.4 % (ref 0.0–5.0)
Eosinophils Absolute: 0.1 10*3/uL (ref 0.0–0.7)
HEMATOCRIT: 41.7 % (ref 36.0–46.0)
HEMOGLOBIN: 14 g/dL (ref 12.0–15.0)
LYMPHS ABS: 1.6 10*3/uL (ref 0.7–4.0)
LYMPHS PCT: 28.4 % (ref 12.0–46.0)
MCHC: 33.7 g/dL (ref 30.0–36.0)
MCV: 94.7 fl (ref 78.0–100.0)
MONOS PCT: 6.9 % (ref 3.0–12.0)
Monocytes Absolute: 0.4 10*3/uL (ref 0.1–1.0)
NEUTROS ABS: 3.5 10*3/uL (ref 1.4–7.7)
Neutrophils Relative %: 62.3 % (ref 43.0–77.0)
Platelets: 269 10*3/uL (ref 150.0–400.0)
RBC: 4.4 Mil/uL (ref 3.87–5.11)
RDW: 13.2 % (ref 11.5–15.5)
WBC: 5.6 10*3/uL (ref 4.0–10.5)

## 2017-06-19 LAB — LIPID PANEL
CHOLESTEROL: 208 mg/dL — AB (ref 0–200)
HDL: 62.4 mg/dL (ref >39.00)
LDL CALC: 120 mg/dL — AB (ref 0–99)
NonHDL: 145.77
TRIGLYCERIDES: 127 mg/dL (ref 0.0–149.0)
Total CHOL/HDL Ratio: 3
VLDL: 25.4 mg/dL (ref 0.0–40.0)

## 2017-06-19 LAB — COMPREHENSIVE METABOLIC PANEL
ALBUMIN: 4.1 g/dL (ref 3.5–5.2)
ALK PHOS: 63 U/L (ref 39–117)
ALT: 15 U/L (ref 0–35)
AST: 17 U/L (ref 0–37)
BUN: 11 mg/dL (ref 6–23)
CALCIUM: 8.9 mg/dL (ref 8.4–10.5)
CHLORIDE: 106 meq/L (ref 96–112)
CO2: 29 mEq/L (ref 19–32)
Creatinine, Ser: 0.97 mg/dL (ref 0.40–1.20)
GFR: 63.27 mL/min (ref >60.00)
Glucose, Bld: 100 mg/dL — ABNORMAL HIGH (ref 70–99)
POTASSIUM: 4.6 meq/L (ref 3.5–5.1)
Sodium: 143 mEq/L (ref 135–145)
TOTAL PROTEIN: 7.2 g/dL (ref 6.0–8.3)
Total Bilirubin: 0.5 mg/dL (ref 0.2–1.2)

## 2017-06-19 LAB — TSH: TSH: 2.56 u[IU]/mL (ref 0.35–4.50)

## 2017-06-19 NOTE — Telephone Encounter (Signed)
-----   Message from Greig Right sent at 06/19/2017  9:03 AM EDT ----- Regarding: Lab Orders  Please enter lab orders for Raven Gibson.  Thank you :)  Fredric Mare

## 2017-06-21 ENCOUNTER — Telehealth: Payer: Self-pay | Admitting: Family Medicine

## 2017-06-21 NOTE — Telephone Encounter (Signed)
-----   Message from Alvina Chou sent at 06/17/2017  3:33 PM EDT ----- Regarding: Lab orders for Friday, 10.12.18 Patient is scheduled for CPX labs, please order future labs, Thanks , Camelia Eng

## 2017-06-21 NOTE — Telephone Encounter (Signed)
I think they were done on the 12th?

## 2017-07-03 ENCOUNTER — Ambulatory Visit (INDEPENDENT_AMBULATORY_CARE_PROVIDER_SITE_OTHER): Payer: PRIVATE HEALTH INSURANCE | Admitting: Family Medicine

## 2017-07-03 ENCOUNTER — Encounter: Payer: Self-pay | Admitting: Family Medicine

## 2017-07-03 VITALS — BP 114/72 | HR 57 | Temp 98.5°F | Ht 58.75 in | Wt 233.5 lb

## 2017-07-03 DIAGNOSIS — L304 Erythema intertrigo: Secondary | ICD-10-CM | POA: Diagnosis not present

## 2017-07-03 DIAGNOSIS — R7309 Other abnormal glucose: Secondary | ICD-10-CM | POA: Insufficient documentation

## 2017-07-03 DIAGNOSIS — Z23 Encounter for immunization: Secondary | ICD-10-CM | POA: Diagnosis not present

## 2017-07-03 DIAGNOSIS — Z1211 Encounter for screening for malignant neoplasm of colon: Secondary | ICD-10-CM | POA: Diagnosis not present

## 2017-07-03 DIAGNOSIS — E785 Hyperlipidemia, unspecified: Secondary | ICD-10-CM | POA: Diagnosis not present

## 2017-07-03 DIAGNOSIS — Z0000 Encounter for general adult medical examination without abnormal findings: Secondary | ICD-10-CM

## 2017-07-03 DIAGNOSIS — Z1231 Encounter for screening mammogram for malignant neoplasm of breast: Secondary | ICD-10-CM | POA: Diagnosis not present

## 2017-07-03 MED ORDER — NYSTATIN 100000 UNIT/GM EX POWD: Freq: Three times a day (TID) | CUTANEOUS | 0 refills | Status: DC | PRN

## 2017-07-03 NOTE — Assessment & Plan Note (Signed)
100 fasting  disc imp of low glycemic diet and wt loss to prevent DM2

## 2017-07-03 NOTE — Assessment & Plan Note (Signed)
Mild - uncer L breast and in groin  Resembles candida Trial of nystatin powder tid to qid Disc imp of staying dry as well

## 2017-07-03 NOTE — Assessment & Plan Note (Signed)
Discussed how this problem influences overall health and the risks it imposes  Reviewed plan for weight loss with lower calorie diet (via better food choices and also portion control or program like weight watchers) and exercise building up to or more than 30 minutes 5 days per week including some aerobic activity   Not a lot of time for self care Disc eating less refined carbs

## 2017-07-03 NOTE — Progress Notes (Signed)
Subjective:    Patient ID: Raven Gibson, female    DOB: 1961/03/31, 56 y.o.   MRN: [id number]  HPI  Here for health maintenance exam and to review chronic medical problems    Doing ok  Taking care of her husband with severe copd   Wt Readings from Last 3 Encounters:  07/03/17 233 lb 8 oz (105.9 kg)  09/04/16 225 lb 6.4 oz (102.2 kg)  01/08/16 233 lb 4 oz (105.8 kg)  self care is fair with caregiving of her husband  47.56 kg/m   Dealing with heat rash - under breasts and in folds of thighs  Some itching   Flu vaccine -will get today   Colon cancer screening  Declines colonoscopy  ifob 1/16 nl  Interested in cologuard if covered   Mammogram 4/17 neg  We will ref for it - breast center  Self breast exam -no lumps   Pap 12/17 nl with neg hpv (gyn) Has appt in Jan with gyn   Td 1/10  Hyperlipidemia Lab Results  Component Value Date   CHOL 208 (H) 06/19/2017   CHOL 204 (H) 01/04/2016   CHOL 187 07/03/2014   Lab Results  Component Value Date   HDL 62.40 06/19/2017   HDL 61.10 01/04/2016   HDL 55.10 07/03/2014   Lab Results  Component Value Date   LDLCALC 120 (H) 06/19/2017   LDLCALC 107 (H) 01/04/2016   LDLCALC 106 (H) 07/03/2014   Lab Results  Component Value Date   TRIG 127.0 06/19/2017   TRIG 183.0 (H) 01/04/2016   TRIG 129.0 07/03/2014   Lab Results  Component Value Date   CHOLHDL 3 06/19/2017   CHOLHDL 3 01/04/2016   CHOLHDL 3 07/03/2014   Lab Results  Component Value Date   LDLDIRECT 130.8 09/11/2009   LDLDIRECT 124.2 09/15/2007    LDL is up to 120  Has cut back on bacon and sausage  occ fried food  Hamburger 1-2 times per month   Glucose 100 - will watch  Eating more pop tarts   Other labs Results for orders placed or performed in visit on 06/19/17  CBC with Differential/Platelet  Result Value Ref Range   WBC 5.6 4.0 - 10.5 K/uL   RBC 4.40 3.87 - 5.11 Mil/uL   Hemoglobin 14.0 12.0 - 15.0 g/dL   HCT 40.9 81.1 - 91.4 %   MCV  94.7 78.0 - 100.0 fl   MCHC 33.7 30.0 - 36.0 g/dL   RDW 78.2 95.6 - 21.3 %   Platelets 269.0 150.0 - 400.0 K/uL   Neutrophils Relative % 62.3 43.0 - 77.0 %   Lymphocytes Relative 28.4 12.0 - 46.0 %   Monocytes Relative 6.9 3.0 - 12.0 %   Eosinophils Relative 1.4 0.0 - 5.0 %   Basophils Relative 1.0 0.0 - 3.0 %   Neutro Abs 3.5 1.4 - 7.7 K/uL   Lymphs Abs 1.6 0.7 - 4.0 K/uL   Monocytes Absolute 0.4 0.1 - 1.0 K/uL   Eosinophils Absolute 0.1 0.0 - 0.7 K/uL   Basophils Absolute 0.1 0.0 - 0.1 K/uL  Comprehensive metabolic panel  Result Value Ref Range   Sodium 143 135 - 145 mEq/L   Potassium 4.6 3.5 - 5.1 mEq/L   Chloride 106 96 - 112 mEq/L   CO2 29 19 - 32 mEq/L   Glucose, Bld 100 (H) 70 - 99 mg/dL   BUN 11 6 - 23 mg/dL   Creatinine, Ser 0.86 0.40 - 1.20 mg/dL  Total Bilirubin 0.5 0.2 - 1.2 mg/dL   Alkaline Phosphatase 63 39 - 117 U/L   AST 17 0 - 37 U/L   ALT 15 0 - 35 U/L   Total Protein 7.2 6.0 - 8.3 g/dL   Albumin 4.1 3.5 - 5.2 g/dL   Calcium 8.9 8.4 - 65.710.5 mg/dL   GFR 84.6963.27 >62.95>60.00 mL/min  Lipid panel  Result Value Ref Range   Cholesterol 208 (H) 0 - 200 mg/dL   Triglycerides 284.1127.0 0.0 - 149.0 mg/dL   HDL 32.4462.40 >01.02>39.00 mg/dL   VLDL 72.525.4 0.0 - 36.640.0 mg/dL   LDL Cholesterol 440120 (H) 0 - 99 mg/dL   Total CHOL/HDL Ratio 3    NonHDL 145.77   TSH  Result Value Ref Range   TSH 2.56 0.35 - 4.50 uIU/mL     Patient Active Problem List   Diagnosis Date Noted  . Encounter for screening mammogram for breast cancer 07/03/2017  . Intertrigo 07/03/2017  . Elevated glucose level 07/03/2017  . Severe obesity (BMI >= 40) (HCC) 04/14/2013  . Colon cancer screening 04/13/2013  . Routine general medical examination at a health care facility 01/13/2012  . Hyperlipidemia, mild 09/14/2007  . 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 GERD 09/14/2007  . OSTEOARTHRITIS 09/14/2007  . DEGENERATIVE DISC DISEASE 09/14/2007   Past Medical History:  Diagnosis Date  . DDD (degenerative disc  disease)   . Fatty liver   . GERD (gastroesophageal reflux disease)   . Obesity    Past Surgical History:  Procedure Laterality Date  . CHOLECYSTECTOMY    . LUMBAR SPINE SURGERY     ruptured disk   Social History  Substance Use Topics  . Smoking status: Never Smoker  . Smokeless tobacco: Never Used  . Alcohol use No   Family History  Problem Relation Age of Onset  . Hypertension Mother   . Hyperlipidemia Mother   . Hypertension Father        MI x 3   Allergies  Allergen Reactions  . Codeine     REACTION: nausea and vomiting   Current Outpatient Prescriptions on File Prior to Visit  Medication Sig Dispense Refill  . Ascorbic Acid (VITAMIN C) 1000 MG tablet Take 1,000 mg by mouth daily.    . Glucosamine-Chondroitin (MOVE FREE PO) Take 1 tablet by mouth daily.    . mometasone (ELOCON) 0.1 % cream APPLY TO AFFECTED AREA(S) ONCE DAILY AS DIRECTED 45 g 0  . Naproxen Sodium (ALEVE PO) Take by mouth as needed.     No current facility-administered medications on file prior to visit.     Review of Systems  Constitutional: Positive for fatigue. Negative for activity change, appetite change, fever and unexpected weight change.  HENT: Negative for congestion, ear pain, rhinorrhea, sinus pressure and sore throat.   Eyes: Negative for pain, redness and visual disturbance.  Respiratory: Negative for cough, shortness of breath and wheezing.   Cardiovascular: Negative for chest pain and palpitations.  Gastrointestinal: Negative for abdominal pain, blood in stool, constipation and diarrhea.  Endocrine: Negative for polydipsia and polyuria.  Genitourinary: Negative for dysuria, frequency and urgency.  Musculoskeletal: Negative for arthralgias, back pain and myalgias.  Skin: Negative for pallor and rash.  Allergic/Immunologic: Negative for environmental allergies.  Neurological: Negative for dizziness, syncope and headaches.  Hematological: Negative for adenopathy. Does not bruise/bleed  easily.  Psychiatric/Behavioral: Negative for decreased concentration and dysphoric mood. The patient is not nervous/anxious.        Some caregiver  stress       Objective:   Physical Exam  Constitutional: She appears well-developed and well-nourished. No distress.  Morbidly obese and well app  HENT:  Head: Normocephalic and atraumatic.  Right Ear: External ear normal.  Left Ear: External ear normal.  Nose: Nose normal.  Mouth/Throat: Oropharynx is clear and moist.  Eyes: Pupils are equal, round, and reactive to light. Conjunctivae and EOM are normal. Right eye exhibits no discharge. Left eye exhibits no discharge. No scleral icterus.  Neck: Normal range of motion. Neck supple. No JVD present. Carotid bruit is not present. No thyromegaly present.  Cardiovascular: Normal rate, regular rhythm, normal heart sounds and intact distal pulses.  Exam reveals no gallop.   Pulmonary/Chest: Effort normal and breath sounds normal. No respiratory distress. She has no wheezes. She has no rales.  Abdominal: Soft. Bowel sounds are normal. She exhibits no distension and no mass. There is no tenderness.  Musculoskeletal: She exhibits no edema or tenderness.  Lymphadenopathy:    She has no cervical adenopathy.  Neurological: She is alert. She has normal reflexes. No cranial nerve deficit. She exhibits normal muscle tone. Coordination normal.  Skin: Skin is warm and dry. No rash noted. No erythema. No pallor.  Mild rash consistent with candidal intertrigo - erythema with satellite lesions and no skin breakdown  Under L breast and in groin  Few skin tags  Psychiatric: She has a normal mood and affect.          Assessment & Plan:   Problem List Items Addressed This Visit      Musculoskeletal and Integument   Intertrigo    Mild - uncer L breast and in groin  Resembles candida Trial of nystatin powder tid to qid Disc imp of staying dry as well         Other   Colon cancer screening     Declines colonoscopy  Will look into coverage for cologuard and let us know  If not affordable -will do ifob instead       Elevated glucose level    100 fasting  disc imp of low glycemic diet and wt loss to prevent DM2       Encounter for screening mammogram for breast cancer    Mammogram ordered  Pt will have breast exam from her gyn in Jan Enc self exams      Relevant Orders   MM DIGITAL SCREENING BILATERAL   Hyperlipidemia, mild    Disc goals for lipids and reasons to control them Rev labs with pt Rev low sat fat diet in detail LDL up a bit-disc diet changes       Routine general medical examination at a health care facility - Primary    Reviewed health habits including diet and exercise and skin cancer prevention Reviewed appropriate screening tests for age  Also reviewed health mt list, fam hx and immunization status , as well as social and family history   See HPI Labs rev  Disc need for wt loss with lifestyle change  Lower carb for recent inc glucose  Flu shot today Will check into cov of cologuard /declines colonoscopy Some caregiver stress- reminded re: imp of self care       Severe obesity (BMI >= 40) (HCC)    Discussed how this problem influences overall health and the risks it imposes  Reviewed plan for weight loss with lower calorie diet (via better food choices and also portion control or program like weight watchers)  and exercise building up to or more than 30 minutes 5 days per week including some aerobic activity   Not a lot of time for self care Disc eating less refined carbs      Relevant Medications   Alum Hydroxide-Mag Carbonate (GAVISCON PO)    Other Visit Diagnoses    Need for influenza vaccination       Relevant Orders   Flu Vaccine QUAD 6+ mos PF IM (Fluarix Quad PF) (Completed)

## 2017-07-03 NOTE — Assessment & Plan Note (Signed)
Disc goals for lipids and reasons to control them Rev labs with pt Rev low sat fat diet in detail LDL up a bit-disc diet changes

## 2017-07-03 NOTE — Assessment & Plan Note (Signed)
Reviewed health habits including diet and exercise and skin cancer prevention Reviewed appropriate screening tests for age  Also reviewed health mt list, fam hx and immunization status , as well as social and family history   See HPI Labs rev  Disc need for wt loss with lifestyle change  Lower carb for recent inc glucose  Flu shot today Will check into cov of cologuard /declines colonoscopy Some caregiver stress- reminded re: imp of self care

## 2017-07-03 NOTE — Assessment & Plan Note (Addendum)
Mammogram ordered  Pt will have breast exam from her gyn in Jan Enc self exams

## 2017-07-03 NOTE — Assessment & Plan Note (Signed)
Declines colonoscopy  Will look into coverage for cologuard and let us know  If not affordable -will do ifob instead

## 2017-07-03 NOTE — Patient Instructions (Addendum)
Please see if cologuard is covered for colon screening - and if not we will give you our ifob kit   We will refer you for a mammogram   Cholesterol is up slightly  Avoid red meat/ fried foods/ egg yolks/ fatty breakfast meats/ butter, cheese and high fat dairy/ and shellfish    For diabetes prevention    Try to get most of your carbohydrates from produce (with the exception of white potatoes)  Eat less bread/pasta/rice/snack foods/cereals/sweets and other items from the middle of the grocery store (processed carbs)  I think the heat rash is yeast  Try the nystatin powder and try to keep as dry as possible  After bathing it helps to use a hair dryer on the cool setting to dry area extra well  Update if not starting to improve in a week or if worsening    Flu shot today

## 2017-07-17 ENCOUNTER — Other Ambulatory Visit: Payer: Self-pay | Admitting: Family Medicine

## 2017-07-17 DIAGNOSIS — Z1231 Encounter for screening mammogram for malignant neoplasm of breast: Secondary | ICD-10-CM

## 2017-08-17 ENCOUNTER — Ambulatory Visit: Payer: PRIVATE HEALTH INSURANCE

## 2017-09-09 ENCOUNTER — Encounter: Payer: PRIVATE HEALTH INSURANCE | Admitting: Women's Health

## 2017-09-14 ENCOUNTER — Ambulatory Visit
Admission: RE | Admit: 2017-09-14 | Discharge: 2017-09-14 | Disposition: A | Discharge status: Home / Self Care | Payer: PRIVATE HEALTH INSURANCE | Source: Ambulatory Visit | Attending: Family Medicine | Admitting: Family Medicine

## 2017-09-14 DIAGNOSIS — Z1231 Encounter for screening mammogram for malignant neoplasm of breast: Secondary | ICD-10-CM

## 2017-10-08 ENCOUNTER — Encounter: Payer: Self-pay | Admitting: Family Medicine

## 2017-10-19 ENCOUNTER — Encounter: Payer: Self-pay | Admitting: Women's Health

## 2017-10-19 ENCOUNTER — Ambulatory Visit: Payer: PRIVATE HEALTH INSURANCE | Admitting: Women's Health

## 2017-10-19 VITALS — BP 126/80 | Ht <= 58 in | Wt 237.0 lb

## 2017-10-19 DIAGNOSIS — Z01419 Encounter for gynecological examination (general) (routine) without abnormal findings: Secondary | ICD-10-CM

## 2017-10-19 MED ORDER — NYSTATIN-TRIAMCINOLONE 100000-0.1 UNIT/GM-% EX OINT: 1.0000 "application " | TOPICAL_OINTMENT | Freq: Two times a day (BID) | CUTANEOUS | 1 refills | Status: DC

## 2017-10-19 NOTE — Patient Instructions (Addendum)
Health Maintenance for Postmenopausal Women Menopause is a normal process in which your reproductive ability comes to an end. This process happens gradually over a span of months to years, usually between the ages of 22 and 9. Menopause is complete when you have missed 12 consecutive menstrual periods. It is important to talk with your health care provider about some of the most common conditions that affect postmenopausal women, such as heart disease, cancer, and bone loss (osteoporosis). Adopting a healthy lifestyle and getting preventive care can help to promote your health and wellness. Those actions can also lower your chances of developing some of these common conditions. What should I know about menopause? During menopause, you may experience a number of symptoms, such as:  Moderate-to-severe hot flashes.  Night sweats.  Decrease in sex drive.  Mood swings.  Headaches.  Tiredness.  Irritability.  Memory problems.  Insomnia.  Choosing to treat or not to treat menopausal changes is an individual decision that you make with your health care provider. What should I know about hormone replacement therapy and supplements? Hormone therapy products are effective for treating symptoms that are associated with menopause, such as hot flashes and night sweats. Hormone replacement carries certain risks, especially as you become older. If you are thinking about using estrogen or estrogen with progestin treatments, discuss the benefits and risks with your health care provider. What should I know about heart disease and stroke? Heart disease, heart attack, and stroke become more likely as you age. This may be due, in part, to the hormonal changes that your body experiences during menopause. These can affect how your body processes dietary fats, triglycerides, and cholesterol. Heart attack and stroke are both medical emergencies. There are many things that you can do to help prevent heart disease  and stroke:  Have your blood pressure checked at least every 1-2 years. High blood pressure causes heart disease and increases the risk of stroke.  If you are 53-22 years old, ask your health care provider if you should take aspirin to prevent a heart attack or a stroke.  Do not use any tobacco products, including cigarettes, chewing tobacco, or electronic cigarettes. If you need help quitting, ask your health care provider.  It is important to eat a healthy diet and maintain a healthy weight. ? Be sure to include plenty of vegetables, fruits, low-fat dairy products, and lean protein. ? Avoid eating foods that are high in solid fats, added sugars, or salt (sodium).  Get regular exercise. This is one of the most important things that you can do for your health. ? Try to exercise for at least 150 minutes each week. The type of exercise that you do should increase your heart rate and make you sweat. This is known as moderate-intensity exercise. ? Try to do strengthening exercises at least twice each week. Do these in addition to the moderate-intensity exercise.  Know your numbers.Ask your health care provider to check your cholesterol and your blood glucose. Continue to have your blood tested as directed by your health care provider.  What should I know about cancer screening? There are several types of cancer. Take the following steps to reduce your risk and to catch any cancer development as early as possible. Breast Cancer  Practice breast self-awareness. ? This means understanding how your breasts normally appear and feel. ? It also means doing regular breast self-exams. Let your health care provider know about any changes, no matter how small.  If you are 40  or older, have a clinician do a breast exam (clinical breast exam or CBE) every year. Depending on your age, family history, and medical history, it may be recommended that you also have a yearly breast X-ray (mammogram).  If you  have a family history of breast cancer, talk with your health care provider about genetic screening.  If you are at high risk for breast cancer, talk with your health care provider about having an MRI and a mammogram every year.  Breast cancer (BRCA) gene test is recommended for women who have family members with BRCA-related cancers. Results of the assessment will determine the need for genetic counseling and BRCA1 and for BRCA2 testing. BRCA-related cancers include these types: ? Breast. This occurs in males or females. ? Ovarian. ? Tubal. This may also be called fallopian tube cancer. ? Cancer of the abdominal or pelvic lining (peritoneal cancer). ? Prostate. ? Pancreatic.  Cervical, Uterine, and Ovarian Cancer Your health care provider may recommend that you be screened regularly for cancer of the pelvic organs. These include your ovaries, uterus, and vagina. This screening involves a pelvic exam, which includes checking for microscopic changes to the surface of your cervix (Pap test).  For women ages 21-65, health care providers may recommend a pelvic exam and a Pap test every three years. For women ages 79-65, they may recommend the Pap test and pelvic exam, combined with testing for human papilloma virus (HPV), every five years. Some types of HPV increase your risk of cervical cancer. Testing for HPV may also be done on women of any age who have unclear Pap test results.  Other health care providers may not recommend any screening for nonpregnant women who are considered low risk for pelvic cancer and have no symptoms. Ask your health care provider if a screening pelvic exam is right for you.  If you have had past treatment for cervical cancer or a condition that could lead to cancer, you need Pap tests and screening for cancer for at least 20 years after your treatment. If Pap tests have been discontinued for you, your risk factors (such as having a new sexual partner) need to be  reassessed to determine if you should start having screenings again. Some women have medical problems that increase the chance of getting cervical cancer. In these cases, your health care provider may recommend that you have screening and Pap tests more often.  If you have a family history of uterine cancer or ovarian cancer, talk with your health care provider about genetic screening.  If you have vaginal bleeding after reaching menopause, tell your health care provider.  There are currently no reliable tests available to screen for ovarian cancer.  Lung Cancer Lung cancer screening is recommended for adults 69-62 years old who are at high risk for lung cancer because of a history of smoking. A yearly low-dose CT scan of the lungs is recommended if you:  Currently smoke.  Have a history of at least 30 pack-years of smoking and you currently smoke or have quit within the past 15 years. A pack-year is smoking an average of one pack of cigarettes per day for one year.  Yearly screening should:  Continue until it has been 15 years since you quit.  Stop if you develop a health problem that would prevent you from having lung cancer treatment.  Colorectal Cancer  This type of cancer can be detected and can often be prevented.  Routine colorectal cancer screening usually begins at  age 42 and continues through age 45.  If you have risk factors for colon cancer, your health care provider may recommend that you be screened at an earlier age.  If you have a family history of colorectal cancer, talk with your health care provider about genetic screening.  Your health care provider may also recommend using home test kits to check for hidden blood in your stool.  A small camera at the end of a tube can be used to examine your colon directly (sigmoidoscopy or colonoscopy). This is done to check for the earliest forms of colorectal cancer.  Direct examination of the colon should be repeated every  5-10 years until age 71. However, if early forms of precancerous polyps or small growths are found or if you have a family history or genetic risk for colorectal cancer, you may need to be screened more often.  Skin Cancer  Check your skin from head to toe regularly.  Monitor any moles. Be sure to tell your health care provider: ? About any new moles or changes in moles, especially if there is a change in a mole's shape or color. ? If you have a mole that is larger than the size of a pencil eraser.  If any of your family members has a history of skin cancer, especially at a [place] age, talk with your health care provider about genetic screening.  Always use sunscreen. Apply sunscreen liberally and repeatedly throughout the day.  Whenever you are outside, protect yourself by wearing long sleeves, pants, a wide-brimmed hat, and sunglasses.  What should I know about osteoporosis? Osteoporosis is a condition in which bone destruction happens more quickly than new bone creation. After menopause, you may be at an increased risk for osteoporosis. To help prevent osteoporosis or the bone fractures that can happen because of osteoporosis, the following is recommended:  If you are 46-71 years old, get at least 1,000 mg of calcium and at least 600 mg of vitamin D per day.  If you are older than age 55 but younger than age 65, get at least 1,200 mg of calcium and at least 600 mg of vitamin D per day.  If you are older than age 54, get at least 1,200 mg of calcium and at least 800 mg of vitamin D per day.  Smoking and excessive alcohol intake increase the risk of osteoporosis. Eat foods that are rich in calcium and vitamin D, and do weight-bearing exercises several times each week as directed by your health care provider. What should I know about how menopause affects my mental health? Depression may occur at any age, but it is more common as you become older. Common symptoms of depression  include:  Low or sad mood.  Changes in sleep patterns.  Changes in appetite or eating patterns.  Feeling an overall lack of motivation or enjoyment of activities that you previously enjoyed.  Frequent crying spells.  Talk with your health care provider if you think that you are experiencing depression. What should I know about immunizations? It is important that you get and maintain your immunizations. These include:  Tetanus, diphtheria, and pertussis (Tdap) booster vaccine.  Influenza every year before the flu season begins.  Pneumonia vaccine.  Shingles vaccine.  Your health care provider may also recommend other immunizations. This information is not intended to replace advice given to you by your health care provider. Make sure you discuss any questions you have with your health care provider. Document Released: 10/17/2005  Document Revised: 03/14/2016 Document Reviewed: 05/29/2015 Elsevier Interactive Patient Education  2018 [employer].  Carbohydrate Counting for Diabetes Mellitus, Adult Carbohydrate counting is a method for keeping track of how many carbohydrates you eat. Eating carbohydrates naturally increases the amount of sugar (glucose) in the blood. Counting how many carbohydrates you eat helps keep your blood glucose within normal limits, which helps you manage your diabetes (diabetes mellitus). It is important to know how many carbohydrates you can safely have in each meal. This is different for every person. A diet and nutrition specialist (registered dietitian) can help you make a meal plan and calculate how many carbohydrates you should have at each meal and snack. Carbohydrates are found in the following foods:  Grains, such as breads and cereals.  Dried beans and soy products.  Starchy vegetables, such as potatoes, peas, and corn.  Fruit and fruit juices.  Milk and yogurt.  Sweets and snack foods, such as cake, cookies, candy, chips, and soft  drinks.  How do I count carbohydrates? There are two ways to count carbohydrates in food. You can use either of the methods or a combination of both. Reading "Nutrition Facts" on packaged food The "Nutrition Facts" list is included on the labels of almost all packaged foods and beverages in the [place] It includes:  The serving size.  Information about nutrients in each serving, including the grams (g) of carbohydrate per serving.  To use the "Nutrition Facts":  Decide how many servings you will have.  Multiply the number of servings by the number of carbohydrates per serving.  The resulting number is the total amount of carbohydrates that you will be having.  Learning standard serving sizes of other foods When you eat foods containing carbohydrates that are not packaged or do not include "Nutrition Facts" on the label, you need to measure the servings in order to count the amount of carbohydrates:  Measure the foods that you will eat with a food scale or measuring cup, if needed.  Decide how many standard-size servings you will eat.  Multiply the number of servings by 15. Most carbohydrate-rich foods have about 15 g of carbohydrates per serving. ? For example, if you eat 8 oz (170 g) of strawberries, you will have eaten 2 servings and 30 g of carbohydrates (2 servings x 15 g = 30 g).  For foods that have more than one food mixed, such as soups and casseroles, you must count the carbohydrates in each food that is included.  The following list contains standard serving sizes of common carbohydrate-rich foods. Each of these servings has about 15 g of carbohydrates:   hamburger bun or  English muffin.   oz (15 mL) syrup.   oz (14 g) jelly.  1 slice of bread.  1 six-inch tortilla.  3 oz (85 g) cooked rice or pasta.  4 oz (113 g) cooked dried beans.  4 oz (113 g) starchy vegetable, such as peas, corn, or potatoes.  4 oz (113 g) hot cereal.  4 oz (113 g) mashed potatoes  or  of a large baked potato.  4 oz (113 g) canned or frozen fruit.  4 oz (120 mL) fruit juice.  4-6 crackers.  6 chicken nuggets.  6 oz (170 g) unsweetened dry cereal.  6 oz (170 g) plain fat-free yogurt or yogurt sweetened with artificial sweeteners.  8 oz (240 mL) milk.  8 oz (170 g) fresh fruit or one small piece of fruit.  24 oz (680 g)  popcorn.  Example of carbohydrate counting Sample meal  3 oz (85 g) chicken breast.  6 oz (170 g) brown rice.  4 oz (113 g) corn.  8 oz (240 mL) milk.  8 oz (170 g) strawberries with sugar-free whipped topping. Carbohydrate calculation 1. Identify the foods that contain carbohydrates: ? Rice. ? Corn. ? Milk. ? Strawberries. 2. Calculate how many servings you have of each food: ? 2 servings rice. ? 1 serving corn. ? 1 serving milk. ? 1 serving strawberries. 3. Multiply each number of servings by 15 g: ? 2 servings rice x 15 g = 30 g. ? 1 serving corn x 15 g = 15 g. ? 1 serving milk x 15 g = 15 g. ? 1 serving strawberries x 15 g = 15 g. 4. Add together all of the amounts to find the total grams of carbohydrates eaten: ? 30 g + 15 g + 15 g + 15 g = 75 g of carbohydrates total. This information is not intended to replace advice given to you by your health care provider. Make sure you discuss any questions you have with your health care provider. Document Released: 08/25/2005 Document Revised: 03/14/2016 Document Reviewed: 02/06/2016 Elsevier Interactive Patient Education  2018 [employer]  

## 2017-10-19 NOTE — Progress Notes (Signed)
Raven HutchinsonJanice W Gibson 09/02/1961 [id number]    History:    Presents for annual exam.  Postmenopausal on no HRT with no bleeding. History of a 6 cm asymptomatic fibroid. Primary care manages GERD, degenerative disc disease and osteoarthritis. 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 Normal mammogram history. Has not had a screening colonoscopy.  Past medical history, past surgical history, family history and social history were all reviewed and documented in the EPIC chart. Husband disabled from COPD. Mother died from kidney disease. Has one large dog.  ROS:  A ROS was performed and pertinent positives and negatives are included.  Exam:  Vitals:   10/19/17 1457  Weight: 237 lb (107.5 kg)  Height: 4\' 10"  (1.473 m)   Body mass index is 49.53 kg/m.   General appearance:  Normal Thyroid:  Symmetrical, normal in size, without palpable masses or nodularity. Respiratory  Auscultation:  Clear without wheezing or rhonchi Cardiovascular  Auscultation:  Regular rate, without rubs, murmurs or gallops  Edema/varicosities:  Not grossly evident Abdominal  Soft,nontender, without masses, guarding or rebound.  Liver/spleen:  No organomegaly noted  Hernia:  None appreciated  Skin  Inspection:  Grossly normal   Breasts: Examined lying and sitting.     Right: Without masses, retractions, discharge or axillary adenopathy.     Left: Without masses, retractions, discharge or axillary adenopathy. Gentitourinary   Inguinal/mons:  Excoriated skin/yeast in inguinal folds without inguinal adenopathy  External genitalia:  Normal  BUS/Urethra/Skene's glands:  Normal  Vagina:  Normal  Cervix:  Normal  Uterus:  normal in size, shape and contour.  Midline and mobile  Adnexa/parametria:     Rt: Without masses or tenderness.   Lt: Without masses or tenderness.  Anus and perineum: Normal  Digital rectal exam: Normal sphincter tone without palpated masses or tenderness  Assessment/Plan:  57 y.o. MWF G0  for  annual exam with no GYN complaints.  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 Keep area clean and dry. SBE's, continue annual screening mammogram, calcium rich diet, vitamin D 2000 daily encouraged. Encouraged decreased calories/carbs and increase exercise for weight loss. DEXA. Screening colonoscopy reviewed and encouraged declines planning to do Cologard with primary care. Pap normal with negative HR HPV 2017, new screening guidelines reviewed.  Harrington Challengerancy J Gibson [facility] DivisionWHNP, 3:04 PM 10/19/2017

## 2017-10-20 LAB — URINALYSIS W MICROSCOPIC + REFLEX CULTURE
Bilirubin Urine: NEGATIVE
Glucose, UA: NEGATIVE
HYALINE CAST: NONE SEEN /LPF
Hgb urine dipstick: NEGATIVE
Ketones, ur: NEGATIVE
Nitrites, Initial: NEGATIVE
PH: 6.5 (ref 5.0–8.0)
Protein, ur: NEGATIVE
SPECIFIC GRAVITY, URINE: 1.023 (ref 1.001–1.03)

## 2017-10-27 ENCOUNTER — Encounter: Payer: Self-pay | Admitting: Family Medicine

## 2017-11-05 LAB — COLOGUARD

## 2017-11-06 ENCOUNTER — Encounter: Payer: Self-pay | Admitting: *Deleted

## 2020-05-06 ENCOUNTER — Encounter: Payer: Self-pay | Admitting: Family Medicine

## 2020-05-07 MED ORDER — NYSTATIN-TRIAMCINOLONE 100000-0.1 UNIT/GM-% EX OINT: 1.0000 "application " | TOPICAL_OINTMENT | Freq: Two times a day (BID) | CUTANEOUS | 1 refills | Status: DC | PRN

## 2020-08-27 ENCOUNTER — Encounter: Payer: Self-pay | Admitting: Family Medicine

## 2020-09-04 ENCOUNTER — Encounter: Payer: Self-pay | Admitting: Family Medicine

## 2020-09-04 ENCOUNTER — Other Ambulatory Visit: Payer: Self-pay

## 2020-09-04 ENCOUNTER — Ambulatory Visit: Payer: Managed Care, Other (non HMO) | Admitting: Family Medicine

## 2020-09-04 VITALS — BP 124/80 | HR 64 | Temp 97.4°F | Ht 58.5 in | Wt 239.6 lb

## 2020-09-04 DIAGNOSIS — Z79899 Other long term (current) drug therapy: Secondary | ICD-10-CM | POA: Diagnosis not present

## 2020-09-04 DIAGNOSIS — Z0000 Encounter for general adult medical examination without abnormal findings: Secondary | ICD-10-CM

## 2020-09-04 DIAGNOSIS — E785 Hyperlipidemia, unspecified: Secondary | ICD-10-CM

## 2020-09-04 DIAGNOSIS — K219 Gastro-esophageal reflux disease without esophagitis: Secondary | ICD-10-CM

## 2020-09-04 DIAGNOSIS — R7309 Other abnormal glucose: Secondary | ICD-10-CM | POA: Diagnosis not present

## 2020-09-04 DIAGNOSIS — M1991 Primary osteoarthritis, unspecified site: Secondary | ICD-10-CM

## 2020-09-04 DIAGNOSIS — L309 Dermatitis, unspecified: Secondary | ICD-10-CM | POA: Insufficient documentation

## 2020-09-04 LAB — CBC WITH DIFFERENTIAL/PLATELET
Basophils Absolute: 0.1 10*3/uL (ref 0.0–0.1)
Basophils Relative: 1.1 % (ref 0.0–3.0)
Eosinophils Absolute: 0.1 10*3/uL (ref 0.0–0.7)
Eosinophils Relative: 1.7 % (ref 0.0–5.0)
HCT: 40.5 % (ref 36.0–46.0)
Hemoglobin: 13.7 g/dL (ref 12.0–15.0)
Lymphocytes Relative: 28.9 % (ref 12.0–46.0)
Lymphs Abs: 1.6 10*3/uL (ref 0.7–4.0)
MCHC: 33.8 g/dL (ref 30.0–36.0)
MCV: 93 fl (ref 78.0–100.0)
Monocytes Absolute: 0.3 10*3/uL (ref 0.1–1.0)
Monocytes Relative: 5.6 % (ref 3.0–12.0)
Neutro Abs: 3.4 10*3/uL (ref 1.4–7.7)
Neutrophils Relative %: 62.7 % (ref 43.0–77.0)
Platelets: 289 10*3/uL (ref 150.0–400.0)
RBC: 4.36 Mil/uL (ref 3.87–5.11)
RDW: 13.4 % (ref 11.5–15.5)
WBC: 5.4 10*3/uL (ref 4.0–10.5)

## 2020-09-04 LAB — COMPREHENSIVE METABOLIC PANEL
ALT: 16 U/L (ref 0–35)
AST: 17 U/L (ref 0–37)
Albumin: 4.2 g/dL (ref 3.5–5.2)
Alkaline Phosphatase: 64 U/L (ref 39–117)
BUN: 14 mg/dL (ref 6–23)
CO2: 29 mEq/L (ref 19–32)
Calcium: 9 mg/dL (ref 8.4–10.5)
Chloride: 104 mEq/L (ref 96–112)
Creatinine, Ser: 0.87 mg/dL (ref 0.40–1.20)
GFR: 73.32 mL/min (ref >60.00)
Glucose, Bld: 95 mg/dL (ref 70–99)
Potassium: 4.1 mEq/L (ref 3.5–5.1)
Sodium: 139 mEq/L (ref 135–145)
Total Bilirubin: 0.3 mg/dL (ref 0.2–1.2)
Total Protein: 6.9 g/dL (ref 6.0–8.3)

## 2020-09-04 LAB — LIPID PANEL
Cholesterol: 229 mg/dL — ABNORMAL HIGH (ref 0–200)
HDL: 67 mg/dL (ref >39.00)
LDL Cholesterol: 128 mg/dL — ABNORMAL HIGH (ref 0–99)
NonHDL: 161.87
Total CHOL/HDL Ratio: 3
Triglycerides: 167 mg/dL — ABNORMAL HIGH (ref 0.0–149.0)
VLDL: 33.4 mg/dL (ref 0.0–40.0)

## 2020-09-04 LAB — HEMOGLOBIN A1C: Hgb A1c MFr Bld: 5.5 % (ref 4.6–6.5)

## 2020-09-04 LAB — VITAMIN B12: Vitamin B-12: 635 pg/mL (ref 211–911)

## 2020-09-04 LAB — VITAMIN D 25 HYDROXY (VIT D DEFICIENCY, FRACTURES): VITD: 26.72 ng/mL — ABNORMAL LOW (ref 30.00–100.00)

## 2020-09-04 LAB — TSH: TSH: 2.46 u[IU]/mL (ref 0.35–4.50)

## 2020-09-04 MED ORDER — TRIAMCINOLONE ACETONIDE 0.1 % EX CREA: 1.0000 "application " | TOPICAL_CREAM | Freq: Two times a day (BID) | CUTANEOUS | 1 refills | Status: DC | PRN

## 2020-09-04 NOTE — Assessment & Plan Note (Signed)
Chronic nexium  Checking B12 and vit D due to abs issues with above

## 2020-09-04 NOTE — Assessment & Plan Note (Signed)
Continues nexium Watches diet Adds zantac at night infrequently

## 2020-09-04 NOTE — Assessment & Plan Note (Signed)
Hands, hip and knee (also spine) Takes 1 aleve in am and 1 advil at night with full stomach  Will watch renal fxn Wt loss may help in the future

## 2020-09-04 NOTE — Patient Instructions (Addendum)
Get a covid booster when you can  You can update your tetanus shot a month or more later   Try to get 1200-1500 mg of calcium per day with at least 1000 iu of vitamin D - for bone health If calcium constipates then just take the vitamin D  Take care of yourself the best you can   Labs today   Be sure to schedule your mammogram at the breast center

## 2020-09-04 NOTE — Assessment & Plan Note (Signed)
Discussed how this problem influences overall health and the risks it imposes  Reviewed plan for weight loss with lower calorie diet (via better food choices and also portion control or program like weight watchers) and exercise building up to or more than 30 minutes 5 days per week including some aerobic activity   Pt is caring for husband and father-no time for self care  Enc her to prep produce when she gets a chance  Arthritis limits exercise

## 2020-09-04 NOTE — Assessment & Plan Note (Signed)
Recurrent in the winter Px triamcinolone to use prn  Enc moisturizers and avoidance of hot water and harsh detergents

## 2020-09-04 NOTE — Assessment & Plan Note (Addendum)
Reviewed health habits including diet and exercise and skin cancer prevention Reviewed appropriate screening tests for age  Also reviewed health mt list, fam hx and immunization status , as well as social and family history   See HPI Labs ordered Enc her to get covid booster (she plans to)  Then will need tetanus shot (at least a mo later) Declines colon cancer screening Given number to schedule mammogram  Pap utd  Enc wt loss with better lifestyle habits Enc to start ca and D for bone health

## 2020-09-04 NOTE — Progress Notes (Signed)
Subjective:    Patient ID: Raven Gibson, female    DOB: 10-07-1960, 59 y.o.   MRN: [id number]  This visit occurred during the SARS-CoV-2 public health emergency.  Safety protocols were in place, including screening questions prior to the visit, additional usage of staff PPE, and extensive cleaning of exam room while observing appropriate contact time as indicated for disinfecting solutions.    HPI Pt presents to re est care (has not been here for over 3 y)  Wt Readings from Last 3 Encounters:  09/04/20 239 lb 9 oz (108.7 kg)  10/19/17 237 lb (107.5 kg)  07/03/17 233 lb 8 oz (105.9 kg)   49.22 kg/m  She cares for disabled husband with severe copd  Has never smoked herself  Works 1/2 days from home now and that helps   Takes fair care of self-not a lot of time   OA -hip/knee and hands  Takes aleve in am  advil at bedtime (it helps more than aleve at night)  Does not mix them  Takes with food on stomach   Has a rash on R hand-comes out in the winter Does wash hands a lot  Uses moisturizers (gold bond)      BP Readings from Last 3 Encounters:  09/04/20 124/80  10/19/17 126/80  07/03/17 114/72   Pulse Readings from Last 3 Encounters:  09/04/20 64  07/03/17 (!) 57  01/08/16 66   H/o GERD Takes nexium 20 mg daily -at night  Zantac-prn only if needed-not often   OA- prn aleve or advil   H/o intertrigo and has used nystatin-triacin oint in the past  Under breasts and in groin  Summer is the problem time   Mild hyperlipidemia  Lab Results  Component Value Date   CHOL 208 (H) 06/19/2017   HDL 62.40 06/19/2017   LDLCALC 120 (H) 06/19/2017   LDLDIRECT 130.8 09/11/2009   TRIG 127.0 06/19/2017   CHOLHDL 3 06/19/2017   H/o elevated glucose  Eats for convenience- does not always eat healthy   Health mt  Td 1/10 Flu shot 10/21 Zoster status - declines  covid vaccinated [employer] in march- wants to get a booster   Mammogram 1/19 has not had in a while /no  time  Self breast exam -no lumps   Pap 12/17-neg with neg hpv  No gyn problems   Has declined colonoscopy  Neg ifob 1/16  cologuard 2019  Declines all screening right now   Patient Active Problem List   Diagnosis Date Noted  . Current use of proton pump inhibitor 09/04/2020  . Eczema of right hand 09/04/2020  . Encounter for screening mammogram for breast cancer 07/03/2017  . Intertrigo 07/03/2017  . Elevated glucose level 07/03/2017  . Severe obesity (BMI >= 40) (HCC) 04/14/2013  . Colon cancer screening 04/13/2013  . Routine general medical examination at a health care facility 01/13/2012  . 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 GERD 09/14/2007  . Osteoarthritis 09/14/2007  . DEGENERATIVE DISC DISEASE 09/14/2007   Past Medical History:  Diagnosis Date  . DDD (degenerative disc disease)   . Fatty liver   . GERD (gastroesophageal reflux disease)   . Obesity    Past Surgical History:  Procedure Laterality Date  . CHOLECYSTECTOMY    . LUMBAR SPINE SURGERY     ruptured disk   Social History   Tobacco Use  . Smoking status: Never Smoker  . Smokeless tobacco: Never Used  Vaping Use  .  Vaping Use: Never used  Substance Use Topics  . Alcohol use: No    Alcohol/week: 0.0 standard drinks  . Drug use: No   Family History  Problem Relation Age of Onset  . Hypertension Mother   . Hyperlipidemia Mother   . Hypertension Father        MI x 3   Allergies  Allergen Reactions  . Codeine     REACTION: nausea and vomiting   Current Outpatient Medications on File Prior to Visit  Medication Sig Dispense Refill  . Alum Hydroxide-Mag Carbonate (GAVISCON PO) Take by mouth daily as needed.    . Ascorbic Acid (VITAMIN C) 1000 MG tablet Take 1,000 mg by mouth daily.    Marland Kitchen esomeprazole (NEXIUM) 20 MG capsule Take 20 mg by mouth daily at 12 noon.    Marland Kitchen ibuprofen (ADVIL) 400 MG tablet Take 400 mg by mouth every 8 (eight) hours as needed.    . Multiple Vitamins-Minerals (MULTIVITAL  PO) Take 1 tablet by mouth 2 (two) times daily at 10 AM and 5 PM.    . Multiple Vitamins-Minerals (PRESERVISION AREDS PO) Take 1 tablet by mouth daily.    . Naproxen Sodium (ALEVE PO) Take by mouth as needed.    . nystatin-triamcinolone ointment (MYCOLOG) Apply 1 application topically 2 (two) times daily as needed. rash 60 g 1  . Omega-3 Fatty Acids (OMEGA-3 FISH OIL PO) Take 1 capsule by mouth daily.    . ranitidine (ZANTAC) 300 MG tablet Take 150 mg by mouth daily as needed for heartburn.    . TURMERIC PO Take 1 capsule by mouth daily. 538mg      No current facility-administered medications on file prior to visit.    Review of Systems  Constitutional: Negative for activity change, appetite change, fatigue, fever and unexpected weight change.  HENT: Negative for congestion, ear pain, rhinorrhea, sinus pressure and sore throat.   Eyes: Negative for pain, redness and visual disturbance.  Respiratory: Negative for cough, shortness of breath and wheezing.   Cardiovascular: Negative for chest pain, palpitations and leg swelling.  Gastrointestinal: Negative for abdominal pain, blood in stool, constipation and diarrhea.  Endocrine: Negative for polydipsia and polyuria.  Genitourinary: Negative for dysuria, frequency and urgency.  Musculoskeletal: Positive for arthralgias and back pain. Negative for myalgias.  Skin: Positive for rash. Negative for pallor.  Allergic/Immunologic: Negative for environmental allergies.  Neurological: Negative for dizziness, syncope and headaches.  Hematological: Negative for adenopathy. Does not bruise/bleed easily.  Psychiatric/Behavioral: Negative for decreased concentration and dysphoric mood. The patient is not nervous/anxious.        Objective:   Physical Exam Constitutional:      General: She is not in acute distress.    Appearance: Normal appearance. She is well-developed. She is obese. She is not ill-appearing or diaphoretic.  HENT:     Head:  Normocephalic and atraumatic.     Right Ear: Tympanic membrane, ear canal and external ear normal.     Left Ear: Tympanic membrane, ear canal and external ear normal.     Nose: Nose normal. No congestion.     Mouth/Throat:     Mouth: Mucous membranes are moist.     Pharynx: Oropharynx is clear. No posterior oropharyngeal erythema.  Eyes:     General: No scleral icterus.    Extraocular Movements: Extraocular movements intact.     Conjunctiva/sclera: Conjunctivae normal.     Pupils: Pupils are equal, round, and reactive to light.  Neck:  Thyroid: No thyromegaly.     Vascular: No carotid bruit or JVD.  Cardiovascular:     Rate and Rhythm: Normal rate and regular rhythm.     Pulses: Normal pulses.     Heart sounds: Normal heart sounds. No gallop.   Pulmonary:     Effort: Pulmonary effort is normal. No respiratory distress.     Breath sounds: Normal breath sounds. No wheezing.     Comments: Good air exch Chest:     Chest wall: No tenderness.  Abdominal:     General: Bowel sounds are normal. There is no distension or abdominal bruit.     Palpations: Abdomen is soft. There is no mass.     Tenderness: There is no abdominal tenderness.     Hernia: No hernia is present.  Genitourinary:    Comments: Breast exam: No mass, nodules, thickening, tenderness, bulging, retraction, inflamation, nipple discharge or skin changes noted.  No axillary or clavicular LA.     Musculoskeletal:        General: No tenderness. Normal range of motion.     Cervical back: Normal range of motion and neck supple. No rigidity. No muscular tenderness.     Right lower leg: No edema.     Left lower leg: No edema.     Comments: Puffy ankles-no pitting edema  No kyphosis   Lymphadenopathy:     Cervical: No cervical adenopathy.  Skin:    General: Skin is warm and dry.     Coloration: Skin is not pale.     Findings: No erythema or rash.     Comments: Fair complexion  Few skin tags  Erythematous rash with  scale on R hand -dorsal /lateral Slight central clearing  Neurological:     Mental Status: She is alert. Mental status is at baseline.     Cranial Nerves: No cranial nerve deficit.     Motor: No abnormal muscle tone.     Coordination: Coordination normal.     Gait: Gait normal.     Deep Tendon Reflexes: Reflexes are normal and symmetric. Reflexes normal.  Psychiatric:        Mood and Affect: Mood normal.        Cognition and Memory: Cognition and memory normal.           Assessment & Plan:   Problem List Items Addressed This Visit      Digestive   GERD    Continues nexium Watches diet Adds zantac at night infrequently        Relevant Medications   esomeprazole (NEXIUM) 20 MG capsule     Musculoskeletal and Integument   Osteoarthritis    Hands, hip and knee (also spine) Takes 1 aleve in am and 1 advil at night with full stomach  Will watch renal fxn Wt loss may help in the future       Relevant Medications   ibuprofen (ADVIL) 400 MG tablet   Eczema of right hand    Recurrent in the winter Px triamcinolone to use prn  Enc moisturizers and avoidance of hot water and harsh detergents         Other   Routine general medical examination at a health care facility - Primary    Reviewed health habits including diet and exercise and skin cancer prevention Reviewed appropriate screening tests for age  Also reviewed health mt list, fam hx and immunization status , as well as social and family history   See HPI Labs  ordered Enc her to get covid booster (she plans to)  Then will need tetanus shot (at least a mo later) Declines colon cancer screening Given number to schedule mammogram  Pap utd  Enc wt loss with better lifestyle habits Enc to start ca and D for bone health      Relevant Orders   CBC with Differential/Platelet   Comprehensive metabolic panel   Lipid panel   TSH   Severe obesity (BMI >= 40) (HCC)    Discussed how this problem influences overall  health and the risks it imposes  Reviewed plan for weight loss with lower calorie diet (via better food choices and also portion control or program like weight watchers) and exercise building up to or more than 30 minutes 5 days per week including some aerobic activity   Pt is caring for husband and father-no time for self care  Enc her to prep produce when she gets a chance  Arthritis limits exercise       Elevated glucose level    Due for labs  Diet is fair (eats for convenience)        Relevant Orders   Hemoglobin A1c   Current use of proton pump inhibitor    Chronic nexium  Checking B12 and vit D due to abs issues with above        Relevant Orders   VITAMIN D 25 Hydroxy (Vit-D Deficiency, Fractures)   Vitamin B12   RESOLVED: Hyperlipidemia, mild   Relevant Orders   Lipid panel

## 2020-09-04 NOTE — Assessment & Plan Note (Signed)
Due for labs  Diet is fair (eats for convenience)

## 2021-11-08 ENCOUNTER — Encounter: Payer: Self-pay | Admitting: Family Medicine

## 2021-11-26 ENCOUNTER — Ambulatory Visit (INDEPENDENT_AMBULATORY_CARE_PROVIDER_SITE_OTHER): Payer: Managed Care, Other (non HMO) | Admitting: Family Medicine

## 2021-11-26 ENCOUNTER — Other Ambulatory Visit: Payer: Self-pay

## 2021-11-26 ENCOUNTER — Encounter: Payer: Self-pay | Admitting: Family Medicine

## 2021-11-26 ENCOUNTER — Other Ambulatory Visit (HOSPITAL_COMMUNITY)
Admission: RE | Admit: 2021-11-26 | Discharge: 2021-11-26 | Disposition: A | Discharge status: Home / Self Care | Payer: Managed Care, Other (non HMO) | Source: Ambulatory Visit | Attending: Family Medicine | Admitting: Family Medicine

## 2021-11-26 VITALS — BP 126/88 | HR 71 | Ht <= 58 in | Wt 237.4 lb

## 2021-11-26 DIAGNOSIS — Z0000 Encounter for general adult medical examination without abnormal findings: Secondary | ICD-10-CM | POA: Diagnosis not present

## 2021-11-26 DIAGNOSIS — K219 Gastro-esophageal reflux disease without esophagitis: Secondary | ICD-10-CM

## 2021-11-26 DIAGNOSIS — R7309 Other abnormal glucose: Secondary | ICD-10-CM

## 2021-11-26 DIAGNOSIS — Z1211 Encounter for screening for malignant neoplasm of colon: Secondary | ICD-10-CM | POA: Diagnosis not present

## 2021-11-26 DIAGNOSIS — Z01419 Encounter for gynecological examination (general) (routine) without abnormal findings: Secondary | ICD-10-CM

## 2021-11-26 DIAGNOSIS — Z23 Encounter for immunization: Secondary | ICD-10-CM | POA: Diagnosis not present

## 2021-11-26 DIAGNOSIS — Z1231 Encounter for screening mammogram for malignant neoplasm of breast: Secondary | ICD-10-CM

## 2021-11-26 DIAGNOSIS — Z79899 Other long term (current) drug therapy: Secondary | ICD-10-CM

## 2021-11-26 LAB — COMPREHENSIVE METABOLIC PANEL
ALT: 15 U/L (ref 0–35)
AST: 18 U/L (ref 0–37)
Albumin: 4.3 g/dL (ref 3.5–5.2)
Alkaline Phosphatase: 74 U/L (ref 39–117)
BUN: 14 mg/dL (ref 6–23)
CO2: 30 mEq/L (ref 19–32)
Calcium: 9.3 mg/dL (ref 8.4–10.5)
Chloride: 101 mEq/L (ref 96–112)
Creatinine, Ser: 0.84 mg/dL (ref 0.40–1.20)
GFR: 75.82 mL/min (ref >60.00)
Glucose, Bld: 95 mg/dL (ref 70–99)
Potassium: 4.3 mEq/L (ref 3.5–5.1)
Sodium: 139 mEq/L (ref 135–145)
Total Bilirubin: 0.4 mg/dL (ref 0.2–1.2)
Total Protein: 6.9 g/dL (ref 6.0–8.3)

## 2021-11-26 LAB — CBC WITH DIFFERENTIAL/PLATELET
Basophils Absolute: 0.1 10*3/uL (ref 0.0–0.1)
Basophils Relative: 1.1 % (ref 0.0–3.0)
Eosinophils Absolute: 0.1 10*3/uL (ref 0.0–0.7)
Eosinophils Relative: 1.5 % (ref 0.0–5.0)
HCT: 41.1 % (ref 36.0–46.0)
Hemoglobin: 13.9 g/dL (ref 12.0–15.0)
Lymphocytes Relative: 28.4 % (ref 12.0–46.0)
Lymphs Abs: 1.6 10*3/uL (ref 0.7–4.0)
MCHC: 33.7 g/dL (ref 30.0–36.0)
MCV: 93.9 fl (ref 78.0–100.0)
Monocytes Absolute: 0.3 10*3/uL (ref 0.1–1.0)
Monocytes Relative: 5.9 % (ref 3.0–12.0)
Neutro Abs: 3.5 10*3/uL (ref 1.4–7.7)
Neutrophils Relative %: 63.1 % (ref 43.0–77.0)
Platelets: 275 10*3/uL (ref 150.0–400.0)
RBC: 4.38 Mil/uL (ref 3.87–5.11)
RDW: 13.5 % (ref 11.5–15.5)
WBC: 5.5 10*3/uL (ref 4.0–10.5)

## 2021-11-26 LAB — LIPID PANEL
Cholesterol: 236 mg/dL — ABNORMAL HIGH (ref 0–200)
HDL: 72.5 mg/dL (ref >39.00)
LDL Cholesterol: 130 mg/dL — ABNORMAL HIGH (ref 0–99)
NonHDL: 163.3
Total CHOL/HDL Ratio: 3
Triglycerides: 166 mg/dL — ABNORMAL HIGH (ref 0.0–149.0)
VLDL: 33.2 mg/dL (ref 0.0–40.0)

## 2021-11-26 LAB — HEMOGLOBIN A1C: Hgb A1c MFr Bld: 5.6 % (ref 4.6–6.5)

## 2021-11-26 LAB — TSH: TSH: 2.07 u[IU]/mL (ref 0.35–5.50)

## 2021-11-26 LAB — VITAMIN B12: Vitamin B-12: 659 pg/mL (ref 211–911)

## 2021-11-26 NOTE — Assessment & Plan Note (Signed)
Routine exam with pap No complaints  

## 2021-11-26 NOTE — Assessment & Plan Note (Signed)
Discussed how this problem influences overall health and the risks it imposes  Reviewed plan for weight loss with lower calorie diet (via better food choices and also portion control or program like weight watchers) and exercise building up to or more than 30 minutes 5 days per week including some aerobic activity    

## 2021-11-26 NOTE — Assessment & Plan Note (Signed)
Last Cologuard was 2019 ?Patient received a new one in the mail and plans to do it ?Declines colonoscopy ?

## 2021-11-26 NOTE — Progress Notes (Signed)
? ?Subjective:  ? ? Patient ID: Raven Gibson, female    DOB: 10/11/1960, 61 y.o.   MRN: [id number] ? ?This visit occurred during the SARS-CoV-2 public health emergency.  Safety protocols were in place, including screening questions prior to the visit, additional usage of staff PPE, and extensive cleaning of exam room while observing appropriate contact time as indicated for disinfecting solutions.  ? ?HPI ?Here for health maintenance exam and to review chronic medical problems  ? ? ?Wt Readings from Last 3 Encounters:  ?11/26/21 237 lb 6.4 oz (107.7 kg)  ?09/04/20 239 lb 9 oz (108.7 kg)  ?10/19/17 237 lb (107.5 kg)  ? ?49.62 kg/m? ? ?Lost her husband  ?Then lost her father right after that  ?Both in January  ?Now her dog is sick  ? ?Has not done any grief counseling  ? ?Still working full time  ? ?Mammogram 2019- due for that and will schedule  ?Self breast exam : no lumps  ? ?Cologuard 10/2017 -is due  ?Got it in the mail  ?Will do that  ? ?Pap 08/2016  -will get today  ?Menopausal  ? ?Tetanus shot given  ?Declines many other vaccines  ?Flu shot utd  ? ? ?BP Readings from Last 3 Encounters:  ?11/26/21 126/88  ?09/04/20 124/80  ?10/19/17 126/80  ? ?Pulse Readings from Last 3 Encounters:  ?11/26/21 71  ?09/04/20 64  ?07/03/17 (!) 57  ? ? ?H/o elevated glucose ?Lab Results  ?Component Value Date  ? HGBA1C 5.5 09/04/2020  ? ?Appetite comes and goes  ?Not really healthy  ? ?Just grabs what she can /convenience foods  ?Not a lot of sweets  ? ?Patient Active Problem List  ? Diagnosis Date Noted  ? Visit for routine gyn exam 11/26/2021  ? Current use of proton pump inhibitor 09/04/2020  ? Eczema of right hand 09/04/2020  ? Encounter for screening mammogram for breast cancer 07/03/2017  ? Intertrigo 07/03/2017  ? Elevated glucose level 07/03/2017  ? Severe obesity (BMI >= 40) (HCC) 04/14/2013  ? Colon cancer screening 04/13/2013  ? Routine general medical examination at a health care facility 01/13/2012  ? REACTIVE AIRWAY  DISEASE 09/14/2007  ? GERD 09/14/2007  ? Osteoarthritis 09/14/2007  ? DEGENERATIVE DISC DISEASE 09/14/2007  ? ?Past Medical History:  ?Diagnosis Date  ? DDD (degenerative disc disease)   ? Fatty liver   ? GERD (gastroesophageal reflux disease)   ? Obesity   ? ?Past Surgical History:  ?Procedure Laterality Date  ? CHOLECYSTECTOMY    ? LUMBAR SPINE SURGERY    ? ruptured disk  ? ?Social History  ? ?Tobacco Use  ? Smoking status: Never  ? Smokeless tobacco: Never  ?Vaping Use  ? Vaping Use: Never used  ?Substance Use Topics  ? Alcohol use: No  ?  Alcohol/week: 0.0 standard drinks  ? Drug use: No  ? ?Family History  ?Problem Relation Age of Onset  ? Hypertension Mother   ? Hyperlipidemia Mother   ? Hypertension Father   ?     MI x 3  ? ?Allergies  ?Allergen Reactions  ? Codeine   ?  REACTION: nausea and vomiting  ? ?Current Outpatient Medications on File Prior to Visit  ?Medication Sig Dispense Refill  ? Alum Hydroxide-Mag Carbonate (GAVISCON PO) Take by mouth daily as needed.    ? Ascorbic Acid (VITAMIN C) 1000 MG tablet Take 1,000 mg by mouth daily.    ? esomeprazole (NEXIUM) 20 MG capsule Take 20  mg by mouth daily at 12 noon.    ? ibuprofen (ADVIL) 400 MG tablet Take 400 mg by mouth every 8 (eight) hours as needed.    ? Multiple Vitamins-Minerals (MULTIVITAL PO) Take 1 tablet by mouth 2 (two) times daily at 10 AM and 5 PM.    ? Naproxen Sodium (ALEVE PO) Take by mouth as needed.    ? nystatin-triamcinolone ointment (MYCOLOG) Apply 1 application topically 2 (two) times daily as needed. rash 60 g 1  ? Omega-3 Fatty Acids (OMEGA-3 FISH OIL PO) Take 1 capsule by mouth daily.    ? triamcinolone (KENALOG) 0.1 % Apply 1 application topically 2 (two) times daily as needed. On rash on hand 30 g 1  ? TURMERIC PO Take 1 capsule by mouth daily. 538mg     ? ?No current facility-administered medications on file prior to visit.  ?  ? ?Review of Systems  ?Constitutional:  Negative for activity change, appetite change, fatigue, fever  and unexpected weight change.  ?HENT:  Negative for congestion, ear pain, rhinorrhea, sinus pressure and sore throat.   ?Eyes:  Negative for pain, redness and visual disturbance.  ?Respiratory:  Negative for cough, shortness of breath and wheezing.   ?Cardiovascular:  Negative for chest pain and palpitations.  ?Gastrointestinal:  Negative for abdominal pain, blood in stool, constipation and diarrhea.  ?Endocrine: Negative for polydipsia and polyuria.  ?Genitourinary:  Positive for menstrual problem. Negative for dysuria, frequency and urgency.  ?Musculoskeletal:  Negative for arthralgias, back pain and myalgias.  ?Skin:  Negative for pallor and rash.  ?Allergic/Immunologic: Negative for environmental allergies.  ?Neurological:  Negative for dizziness, syncope and headaches.  ?Hematological:  Negative for adenopathy. Does not bruise/bleed easily.  ?Psychiatric/Behavioral:  Negative for decreased concentration and dysphoric mood. The patient is not nervous/anxious.   ?     Grief reaction ,lost husband and father  ? ?   ?Objective:  ? Physical Exam ?Constitutional:   ?   General: She is not in acute distress. ?   Appearance: Normal appearance. She is well-developed. She is obese. She is not ill-appearing or diaphoretic.  ?HENT:  ?   Head: Normocephalic and atraumatic.  ?   Right Ear: Tympanic membrane, ear canal and external ear normal.  ?   Left Ear: Tympanic membrane, ear canal and external ear normal.  ?   Nose: Nose normal. No congestion.  ?   Mouth/Throat:  ?   Mouth: Mucous membranes are moist.  ?   Pharynx: Oropharynx is clear. No posterior oropharyngeal erythema.  ?Eyes:  ?   General: No scleral icterus. ?   Extraocular Movements: Extraocular movements intact.  ?   Conjunctiva/sclera: Conjunctivae normal.  ?   Pupils: Pupils are equal, round, and reactive to light.  ?Neck:  ?   Thyroid: No thyromegaly.  ?   Vascular: No carotid bruit or JVD.  ?Cardiovascular:  ?   Rate and Rhythm: Normal rate and regular  rhythm.  ?   Pulses: Normal pulses.  ?   Heart sounds: Normal heart sounds.  ?  No gallop.  ?Pulmonary:  ?   Effort: Pulmonary effort is normal. No respiratory distress.  ?   Breath sounds: Normal breath sounds. No wheezing.  ?   Comments: Good air exch ?Chest:  ?   Chest wall: No tenderness.  ?Abdominal:  ?   General: Bowel sounds are normal. There is no distension or abdominal bruit.  ?   Palpations: Abdomen is soft. There is no mass.  ?  Tenderness: There is no abdominal tenderness.  ?   Hernia: No hernia is present.  ?Genitourinary: ?   Comments: Breast exam: No mass, nodules, thickening, tenderness, bulging, retraction, inflamation, nipple discharge or skin changes noted.  No axillary or clavicular LA.     ?Musculoskeletal:     ?   General: No tenderness. Normal range of motion.  ?   Cervical back: Normal range of motion and neck supple. No rigidity. No muscular tenderness.  ?   Right lower leg: No edema.  ?   Left lower leg: No edema.  ?   Comments: No kyphosis   ?Lymphadenopathy:  ?   Cervical: No cervical adenopathy.  ?Skin: ?   General: Skin is warm and dry.  ?   Coloration: Skin is not pale.  ?   Findings: No erythema or rash.  ?   Comments: Fair  ?Solar lentigines diffusely ?Some sks  ?Neurological:  ?   Mental Status: She is alert. Mental status is at baseline.  ?   Cranial Nerves: No cranial nerve deficit.  ?   Motor: No abnormal muscle tone.  ?   Coordination: Coordination normal.  ?   Gait: Gait normal.  ?   Deep Tendon Reflexes: Reflexes are normal and symmetric.  ?Psychiatric:     ?   Mood and Affect: Mood normal.     ?   Cognition and Memory: Cognition and memory normal.  ? ? ? ? ? ?   ?Assessment & Plan:  ? ?Problem List Items Addressed This Visit   ? ?  ? Digestive  ? GERD  ?  Continues nexium  ?  ?  ?  ? Other  ? Colon cancer screening  ?  Last Cologuard was 2019 ?Patient received a new one in the mail and plans to do it ?Declines colonoscopy ?  ?  ? Current use of proton pump inhibitor  ?   Vitamin B12 level added to lab ?  ?  ? Relevant Orders  ? Vitamin B12  ? Elevated glucose level  ?  A1c ordered ?Obesity noted ?disc imp of low glycemic diet and wt loss to prevent DM2  ?  ?  ? Relevant Orders  ?

## 2021-11-26 NOTE — Patient Instructions (Addendum)
Do the cologuard test  ? ?Call and schedule your mammogram when ready  ? ?Please call the location of your choice from the menu below to schedule your Mammogram and/or Bone Density appointment.   ? ?[place]  ? ?[facility]                ?      Phone:  [phone] ?[address]                               ?[place], [place] [address]                                                             ?Services: Traditional and 3D Mammogram, Bone Density  ? ?[facility]           ?      Phone: [phone] ?[address]                                                       ?[place], [place] [address]    ?Service: Bone Density ONLY  ? *this site does NOT perform mammograms ? ?[facility] [address]                       ? Phone:  [phone] ?[address]                                  ?[place], [place] [address]                                            ?Services:  3D Mammogram and Bone Density  ? ? ?[place] ? ?[facility]   ?Phone:  [phone]   ?[address]                                                                            ?[place], [place] [address]                                            ?Services: 3D Mammogram and Bone Density ? ?[facility])  ?Phone:  [phone]   ?[address]                        ?[place], [place] [address]                                              ?  Services:  3D Mammogram and Bone Density ? ?

## 2021-11-26 NOTE — Assessment & Plan Note (Signed)
A1c ordered ?Obesity noted ?disc imp of low glycemic diet and wt loss to prevent DM2  ?

## 2021-11-26 NOTE — Assessment & Plan Note (Signed)
Reviewed health habits including diet and exercise and skin cancer prevention ?Reviewed appropriate screening tests for age  ?Also reviewed health mt list, fam hx and immunization status , as well as social and family history   ?See HPI ?Labs ordered ?Patient is doing okay with grief currently ?Mammogram ordered patient will call to schedule ?New Cologuard kit arrived and patient plans to do that ?GYN exam and Pap done today ?Tetanus shot updated ?Encouraged self-care and healthy habits ?

## 2021-11-26 NOTE — Assessment & Plan Note (Signed)
Continues nexium  ?

## 2021-11-26 NOTE — Assessment & Plan Note (Signed)
Vitamin B12 level added to lab ?

## 2021-11-26 NOTE — Assessment & Plan Note (Signed)
Mammogram ordered.  Patient will call to schedule. 

## 2021-11-27 ENCOUNTER — Encounter: Payer: Self-pay | Admitting: *Deleted

## 2021-11-28 LAB — CYTOLOGY - PAP
Adequacy: ABSENT
Comment: NEGATIVE
Diagnosis: NEGATIVE
High risk HPV: NEGATIVE

## 2021-11-29 ENCOUNTER — Encounter: Payer: Self-pay | Admitting: Family Medicine

## 2021-12-12 LAB — COLOGUARD

## 2022-01-14 LAB — COLOGUARD

## 2022-01-15 ENCOUNTER — Telehealth: Payer: Self-pay

## 2022-01-15 NOTE — Telephone Encounter (Signed)
Called and lvm for patient to call us back regarding lab results. ?

## 2022-01-15 NOTE — Telephone Encounter (Signed)
-----  Message from Abner Greenspan, MD sent at 01/14/2022  7:07 PM EDT ----- ?For some reason the cologuard is not working/result cannot be obtained from the sample ?No idea why  ? ?We could try ifob kit instead  ?If agreeable please send it or pick up  ?

## 2022-01-15 NOTE — Telephone Encounter (Signed)
Spoke to pt and informed her of the information. She will be by this week to pick up the IFOB which is in the folder.  ?

## 2022-01-30 ENCOUNTER — Other Ambulatory Visit (INDEPENDENT_AMBULATORY_CARE_PROVIDER_SITE_OTHER): Payer: Managed Care, Other (non HMO)

## 2022-01-30 DIAGNOSIS — Z1211 Encounter for screening for malignant neoplasm of colon: Secondary | ICD-10-CM | POA: Diagnosis not present

## 2022-01-31 LAB — FECAL OCCULT BLOOD, IMMUNOCHEMICAL: Fecal Occult Bld: NEGATIVE

## 2022-02-07 ENCOUNTER — Ambulatory Visit
Admission: RE | Admit: 2022-02-07 | Discharge: 2022-02-07 | Disposition: A | Discharge status: Home / Self Care | Payer: Managed Care, Other (non HMO) | Source: Ambulatory Visit | Attending: Family Medicine | Admitting: Family Medicine

## 2022-02-07 DIAGNOSIS — Z1231 Encounter for screening mammogram for malignant neoplasm of breast: Secondary | ICD-10-CM

## 2022-12-23 ENCOUNTER — Telehealth: Payer: Self-pay | Admitting: Family Medicine

## 2022-12-23 DIAGNOSIS — Z79899 Other long term (current) drug therapy: Secondary | ICD-10-CM

## 2022-12-23 DIAGNOSIS — R7309 Other abnormal glucose: Secondary | ICD-10-CM

## 2022-12-23 DIAGNOSIS — Z0000 Encounter for general adult medical examination without abnormal findings: Secondary | ICD-10-CM

## 2022-12-23 NOTE — Telephone Encounter (Signed)
-----   Message from Alvina Chou sent at 12/11/2022 10:29 AM EDT ----- Regarding: Lab orders for Wednesday, 4.17.24 Patient is scheduled for CPX labs, please order future labs, Thanks , Camelia Eng

## 2022-12-24 ENCOUNTER — Other Ambulatory Visit (INDEPENDENT_AMBULATORY_CARE_PROVIDER_SITE_OTHER): Payer: Managed Care, Other (non HMO)

## 2022-12-24 ENCOUNTER — Other Ambulatory Visit: Payer: Managed Care, Other (non HMO)

## 2022-12-24 DIAGNOSIS — Z0000 Encounter for general adult medical examination without abnormal findings: Secondary | ICD-10-CM

## 2022-12-24 DIAGNOSIS — Z79899 Other long term (current) drug therapy: Secondary | ICD-10-CM

## 2022-12-24 DIAGNOSIS — R7309 Other abnormal glucose: Secondary | ICD-10-CM | POA: Diagnosis not present

## 2022-12-25 LAB — COMPREHENSIVE METABOLIC PANEL
ALT: 17 U/L (ref 0–35)
AST: 19 U/L (ref 0–37)
Albumin: 4.1 g/dL (ref 3.5–5.2)
Alkaline Phosphatase: 72 U/L (ref 39–117)
BUN: 14 mg/dL (ref 6–23)
CO2: 30 mEq/L (ref 19–32)
Calcium: 9.1 mg/dL (ref 8.4–10.5)
Chloride: 102 mEq/L (ref 96–112)
Creatinine, Ser: 0.86 mg/dL (ref 0.40–1.20)
GFR: 73.15 mL/min (ref >60.00)
Glucose, Bld: 87 mg/dL (ref 70–99)
Potassium: 4.2 mEq/L (ref 3.5–5.1)
Sodium: 139 mEq/L (ref 135–145)
Total Bilirubin: 0.4 mg/dL (ref 0.2–1.2)
Total Protein: 6.7 g/dL (ref 6.0–8.3)

## 2022-12-25 LAB — CBC WITH DIFFERENTIAL/PLATELET
Basophils Absolute: 0 10*3/uL (ref 0.0–0.1)
Basophils Relative: 0.6 % (ref 0.0–3.0)
Eosinophils Absolute: 0.1 10*3/uL (ref 0.0–0.7)
Eosinophils Relative: 1.3 % (ref 0.0–5.0)
HCT: 40.1 % (ref 36.0–46.0)
Hemoglobin: 13.8 g/dL (ref 12.0–15.0)
Lymphocytes Relative: 28.3 % (ref 12.0–46.0)
Lymphs Abs: 1.9 10*3/uL (ref 0.7–4.0)
MCHC: 34.4 g/dL (ref 30.0–36.0)
MCV: 94 fl (ref 78.0–100.0)
Monocytes Absolute: 0.4 10*3/uL (ref 0.1–1.0)
Monocytes Relative: 5.9 % (ref 3.0–12.0)
Neutro Abs: 4.2 10*3/uL (ref 1.4–7.7)
Neutrophils Relative %: 63.9 % (ref 43.0–77.0)
Platelets: 280 10*3/uL (ref 150.0–400.0)
RBC: 4.27 Mil/uL (ref 3.87–5.11)
RDW: 13.1 % (ref 11.5–15.5)
WBC: 6.6 10*3/uL (ref 4.0–10.5)

## 2022-12-25 LAB — LIPID PANEL
Cholesterol: 215 mg/dL — ABNORMAL HIGH (ref 0–200)
HDL: 79 mg/dL (ref >39.00)
LDL Cholesterol: 115 mg/dL — ABNORMAL HIGH (ref 0–99)
NonHDL: 135.71
Total CHOL/HDL Ratio: 3
Triglycerides: 102 mg/dL (ref 0.0–149.0)
VLDL: 20.4 mg/dL (ref 0.0–40.0)

## 2022-12-25 LAB — VITAMIN B12: Vitamin B-12: 602 pg/mL (ref 211–911)

## 2022-12-25 LAB — HEMOGLOBIN A1C: Hgb A1c MFr Bld: 5.6 % (ref 4.6–6.5)

## 2022-12-25 LAB — TSH: TSH: 1.83 u[IU]/mL (ref 0.35–5.50)

## 2022-12-25 LAB — VITAMIN D 25 HYDROXY (VIT D DEFICIENCY, FRACTURES): VITD: 46.59 ng/mL (ref 30.00–100.00)

## 2022-12-31 ENCOUNTER — Encounter: Payer: Self-pay | Admitting: Family Medicine

## 2022-12-31 ENCOUNTER — Ambulatory Visit (INDEPENDENT_AMBULATORY_CARE_PROVIDER_SITE_OTHER): Payer: Managed Care, Other (non HMO) | Admitting: Family Medicine

## 2022-12-31 VITALS — BP 126/72 | HR 74 | Temp 98.0°F | Ht <= 58 in | Wt 234.2 lb

## 2022-12-31 DIAGNOSIS — Z0000 Encounter for general adult medical examination without abnormal findings: Secondary | ICD-10-CM

## 2022-12-31 DIAGNOSIS — K219 Gastro-esophageal reflux disease without esophagitis: Secondary | ICD-10-CM

## 2022-12-31 DIAGNOSIS — Z79899 Other long term (current) drug therapy: Secondary | ICD-10-CM | POA: Diagnosis not present

## 2022-12-31 DIAGNOSIS — R7309 Other abnormal glucose: Secondary | ICD-10-CM | POA: Diagnosis not present

## 2022-12-31 DIAGNOSIS — Z1231 Encounter for screening mammogram for malignant neoplasm of breast: Secondary | ICD-10-CM

## 2022-12-31 DIAGNOSIS — Z1211 Encounter for screening for malignant neoplasm of colon: Secondary | ICD-10-CM | POA: Diagnosis not present

## 2022-12-31 NOTE — Assessment & Plan Note (Addendum)
Mammogram due in june Pt will call to schedule Nl exam today

## 2022-12-31 NOTE — Assessment & Plan Note (Signed)
Discussed how this problem influences overall health and the risks it imposes  Reviewed plan for weight loss with lower calorie diet (via better food choices and also portion control or program like weight watchers) and exercise building up to or more than 30 minutes 5 days per week including some aerobic activity   Enc lower glycemic diet  Optimally - cut sugar soda Disc opt for low impact exercise  Strength training would be helpful

## 2022-12-31 NOTE — Assessment & Plan Note (Signed)
Lab Results  Component Value Date   HGBA1C 5.6 12/24/2022   disc imp of low glycemic diet and wt loss to prevent DM2

## 2022-12-31 NOTE — Assessment & Plan Note (Signed)
Taking omeprazole 20 mg instead of nexium-works better  Gets otc Not able to wean at this point Enc her to watch diet for triggers  Watching B12 level

## 2022-12-31 NOTE — Progress Notes (Signed)
Subjective:    Patient ID: Raven Gibson, female    DOB: 07-28-1961, 62 y.o.   MRN: [id number]  HPI Here for health maintenance exam and to review chronic medical problems    Wt Readings from Last 3 Encounters:  12/31/22 234 lb 4 oz (106.3 kg)  11/26/21 237 lb 6.4 oz (107.7 kg)  09/04/20 239 lb 9 oz (108.7 kg)   48.96 kg/m  Vitals:   12/31/22 0823  BP: 126/72  Pulse: 74  Temp: 98 F (36.7 C)  SpO2: 98%   Feels ok overall Arthritis is getting worse  Is able to push through the pain   Same job for 42 years  At least 4 more years   Self care is fair   Does not as healthy as she should  Has only 5-10 minutes to eat much of the time  She changed from sweet pastry to cheerios for breakfast  Peanut granola bar at 5:30 am  Lunch is around 11  Chicken is much of her protein  Making a plan to eat salads   Yogurt every day  Pecans and walnuts    Immunization History  Administered Date(s) Administered   Influenza Whole 09/04/2006   Influenza,inj,Quad PF,6+ Mos 08/26/2012, 08/31/2013, 07/10/2014, 07/27/2015, 07/11/2016, 07/03/2017, 07/19/2018, 06/08/2021   Influenza-Unspecified 06/22/2020   Janssen (J&J) SARS-COV-2 Vaccination 11/28/2019   Pneumococcal Polysaccharide-23 09/04/2006, 04/13/2013   Td 07/25/1999, 09/15/2008   Tdap 11/26/2021   Health Maintenance Due  Topic Date Due   Fecal DNA (Cologuard)  10/27/2020   Cologaurd neg 10/2017  negative Tried to repeat in 2023 but something went wrong with the sample  Did ifob kit 01/2022 -was negative   Will try cologuard again   Mammogram 02/2022  Self breast exam-normal   Pap 11/2021  nl with neg HPV  Mood  Still dealing with some grief but has good support   GERD Takes nexium 20 mg daily - then changed to omeprazole  Lab Results  Component Value Date   VITAMINB12 602 12/24/2022   Elevated glucose level Lab Results  Component Value Date   HGBA1C 5.6 12/24/2022  Is stable    Cholesterol Lab Results   Component Value Date   CHOL 215 (H) 12/24/2022   CHOL 236 (H) 11/26/2021   CHOL 229 (H) 09/04/2020   Lab Results  Component Value Date   HDL 79.00 12/24/2022   HDL 72.50 11/26/2021   HDL 67.00 09/04/2020   Lab Results  Component Value Date   LDLCALC 115 (H) 12/24/2022   LDLCALC 130 (H) 11/26/2021   LDLCALC 128 (H) 09/04/2020   Lab Results  Component Value Date   TRIG 102.0 12/24/2022   TRIG 166.0 (H) 11/26/2021   TRIG 167.0 (H) 09/04/2020   Lab Results  Component Value Date   CHOLHDL 3 12/24/2022   CHOLHDL 3 11/26/2021   CHOLHDL 3 09/04/2020   Lab Results  Component Value Date   LDLDIRECT 130.8 09/11/2009   LDLDIRECT 124.2 09/15/2007   Avoids beef much of the time  Some fried foods   Father had heart issues In his 58s  (he died in his 49s)   The 10-year ASCVD risk score (Arnett DK, et al., 2019) is: 2.7%   Values used to calculate the score:     Age: 85 years     Sex: Female     Is Non-Hispanic African American: No     Diabetic: No     Tobacco smoker: No  Systolic Blood Pressure: 126 mmHg     Is BP treated: No     HDL Cholesterol: 79 mg/dL     Total Cholesterol: 215 mg/dL    Last metabolic panel Lab Results  Component Value Date   GLUCOSE 87 12/24/2022   NA 139 12/24/2022   K 4.2 12/24/2022   CL 102 12/24/2022   CO2 30 12/24/2022   BUN 14 12/24/2022   CREATININE 0.86 12/24/2022   CALCIUM 9.1 12/24/2022   PROT 6.7 12/24/2022   ALBUMIN 4.1 12/24/2022   BILITOT 0.4 12/24/2022   ALKPHOS 72 12/24/2022   AST 19 12/24/2022   ALT 17 12/24/2022   Lab Results  Component Value Date   TSH 1.83 12/24/2022   Lab Results  Component Value Date   WBC 6.6 12/24/2022   HGB 13.8 12/24/2022   HCT 40.1 12/24/2022   MCV 94.0 12/24/2022   PLT 280.0 12/24/2022   Patient Active Problem List   Diagnosis Date Noted   Visit for routine gyn exam 11/26/2021   Current use of proton pump inhibitor 09/04/2020   Eczema of right hand 09/04/2020   Encounter  for screening mammogram for breast cancer 07/03/2017   Intertrigo 07/03/2017   Elevated glucose level 07/03/2017   Severe obesity (BMI >= 40) 04/14/2013   Colon cancer screening 04/13/2013   Routine general medical examination at a health care facility 01/13/2012   GERD 09/14/2007   Osteoarthritis 09/14/2007   DEGENERATIVE DISC DISEASE 09/14/2007   Past Medical History:  Diagnosis Date   DDD (degenerative disc disease)    Fatty liver    GERD (gastroesophageal reflux disease)    Obesity    Past Surgical History:  Procedure Laterality Date   CHOLECYSTECTOMY     LUMBAR SPINE SURGERY     ruptured disk   Social History   Tobacco Use   Smoking status: Never   Smokeless tobacco: Never  Vaping Use   Vaping Use: Never used  Substance Use Topics   Alcohol use: No    Alcohol/week: 0.0 standard drinks of alcohol   Drug use: No   Family History  Problem Relation Age of Onset   Hypertension Mother    Hyperlipidemia Mother    Hypertension Father        MI x 3   Allergies  Allergen Reactions   Codeine     REACTION: nausea and vomiting   Current Outpatient Medications on File Prior to Visit  Medication Sig Dispense Refill   Alum Hydroxide-Mag Carbonate (GAVISCON PO) Take by mouth daily as needed.     Ascorbic Acid (VITAMIN C) 1000 MG tablet Take 1,000 mg by mouth daily.     ibuprofen (ADVIL) 400 MG tablet Take 400 mg by mouth every 8 (eight) hours as needed.     Multiple Vitamins-Minerals (MULTIVITAL PO) Take 1 tablet by mouth 2 (two) times daily at 10 AM and 5 PM.     Naproxen Sodium (ALEVE PO) Take by mouth as needed.     nystatin-triamcinolone ointment (MYCOLOG) Apply 1 application topically 2 (two) times daily as needed. rash 60 g 1   Omega-3 Fatty Acids (OMEGA-3 FISH OIL PO) Take 1 capsule by mouth daily.     omeprazole (PRILOSEC) 20 MG capsule Take 20 mg by mouth daily.     triamcinolone (KENALOG) 0.1 % Apply 1 application topically 2 (two) times daily as needed. On rash  on hand 30 g 1   TURMERIC PO Take 1 capsule by mouth daily.   No current facility-administered medications on file prior to visit.     Review of Systems  Constitutional:  Positive for fatigue. Negative for activity change, appetite change, fever and unexpected weight change.  HENT:  Negative for congestion, ear pain, rhinorrhea, sinus pressure and sore throat.   Eyes:  Negative for pain, redness and visual disturbance.  Respiratory:  Negative for cough, shortness of breath and wheezing.   Cardiovascular:  Negative for chest pain and palpitations.  Gastrointestinal:  Negative for abdominal pain, blood in stool, constipation and diarrhea.  Endocrine: Negative for polydipsia and polyuria.  Genitourinary:  Negative for dysuria, frequency and urgency.  Musculoskeletal:  Positive for arthralgias. Negative for back pain and myalgias.  Skin:  Negative for pallor and rash.  Allergic/Immunologic: Negative for environmental allergies.  Neurological:  Negative for dizziness, syncope and headaches.  Hematological:  Negative for adenopathy. Does not bruise/bleed easily.  Psychiatric/Behavioral:  Negative for decreased concentration and dysphoric mood. The patient is not nervous/anxious.        Grief         Objective:   Physical Exam Constitutional:      General: She is not in acute distress.    Appearance: Normal appearance. She is well-developed. She is obese. She is not ill-appearing or diaphoretic.  HENT:     Head: Normocephalic and atraumatic.     Right Ear: Tympanic membrane, ear canal and external ear normal.     Left Ear: Tympanic membrane, ear canal and external ear normal.     Nose: Nose normal. No congestion.     Mouth/Throat:     Mouth: Mucous membranes are moist.     Pharynx: Oropharynx is clear. No posterior oropharyngeal erythema.  Eyes:     General: No scleral icterus.    Extraocular Movements: Extraocular movements intact.     Conjunctiva/sclera: Conjunctivae  normal.     Pupils: Pupils are equal, round, and reactive to light.  Neck:     Thyroid: No thyromegaly.     Vascular: No carotid bruit or JVD.  Cardiovascular:     Rate and Rhythm: Normal rate and regular rhythm.     Pulses: Normal pulses.     Heart sounds: Normal heart sounds.     No gallop.  Pulmonary:     Effort: Pulmonary effort is normal. No respiratory distress.     Breath sounds: Normal breath sounds. No wheezing.     Comments: Good air exch Chest:     Chest wall: No tenderness.  Abdominal:     General: Bowel sounds are normal. There is no distension or abdominal bruit.     Palpations: Abdomen is soft. There is no mass.     Tenderness: There is no abdominal tenderness.     Hernia: No hernia is present.  Genitourinary:    Comments: Breast exam: No mass, nodules, thickening, tenderness, bulging, retraction, inflamation, nipple discharge or skin changes noted.  No axillary or clavicular LA.     Musculoskeletal:        General: No tenderness. Normal range of motion.     Cervical back: Normal range of motion and neck supple. No rigidity. No muscular tenderness.     Right lower leg: No edema.     Left lower leg: No edema.     Comments: No kyphosis   Lymphadenopathy:     Cervical: No cervical adenopathy.  Skin:    General: Skin is warm and dry.     Coloration: Skin is not pale.  Findings: No erythema or rash.     Comments: Solar lentigines diffusely Some sks  Some skin tags Fair complexion   Neurological:     Mental Status: She is alert. Mental status is at baseline.     Cranial Nerves: No cranial nerve deficit.     Motor: No abnormal muscle tone.     Coordination: Coordination normal.     Gait: Gait normal.     Deep Tendon Reflexes: Reflexes are normal and symmetric. Reflexes normal.  Psychiatric:        Mood and Affect: Mood normal.        Cognition and Memory: Cognition and memory normal.           Assessment & Plan:   Problem List Items Addressed This  Visit       Digestive   GERD    Taking omeprazole 20 mg instead of nexium-works better  Gets otc Not able to wean at this point Enc her to watch diet for triggers  Watching B12 level      Relevant Medications   omeprazole (PRILOSEC) 20 MG capsule     Other   Colon cancer screening    Last cologuard had tech problem and did not go through  Did ifob/neg Declines colonoscopy unless pos screen first  Re ordered cologuard  She will clal if any problems       Relevant Orders   Cologuard   Current use of proton pump inhibitor    Watching B12 and renal fxn Lab Results  Component Value Date   VITAMINB12 602 12/24/2022        Elevated glucose level    Lab Results  Component Value Date   HGBA1C 5.6 12/24/2022  disc imp of low glycemic diet and wt loss to prevent DM2       Encounter for screening mammogram for breast cancer    Mammogram due in june Pt will call to schedule Nl exam today      Routine general medical examination at a health care facility - Primary    Reviewed health habits including diet and exercise and skin cancer prevention Reviewed appropriate screening tests for age  Also reviewed health mt list, fam hx and immunization status , as well as social and family history   See HPI Labs reviewed and ordered Cologuard ordered (did not go through last time/tech issue) Open to colonoscopy if this was pos Mammogram due in June- she will call to schedule  Pap utd 11/2021 - nl  Has good support for grief  Enc wt los with lower glycemic diet       Severe obesity (BMI >= 40)    Discussed how this problem influences overall health and the risks it imposes  Reviewed plan for weight loss with lower calorie diet (via better food choices and also portion control or program like weight watchers) and exercise building up to or more than 30 minutes 5 days per week including some aerobic activity   Enc lower glycemic diet  Optimally - cut sugar soda Disc opt for low  impact exercise  Strength training would be helpful

## 2022-12-31 NOTE — Assessment & Plan Note (Signed)
Last cologuard had tech problem and did not go through  Did ifob/neg Declines colonoscopy unless pos screen first  Re ordered cologuard  She will clal if any problems

## 2022-12-31 NOTE — Patient Instructions (Addendum)
For healthy weight and prevention of diabetes  Try to get most of your carbohydrates from produce (with the exception of white potatoes)  Eat less bread/pasta/rice/snack foods/cereals/sweets and other items from the middle of the grocery store (processed carbs)  I will order cologuard  Let us know if you don't hear from the company in 1-2 weeks   Your mammogram is due in June  If you don't get a reminder let us know  You can call a month prior to schedule   Dial back added sugar whenever you can  Keep cutting back the coke if you can

## 2022-12-31 NOTE — Assessment & Plan Note (Signed)
Reviewed health habits including diet and exercise and skin cancer prevention Reviewed appropriate screening tests for age  Also reviewed health mt list, fam hx and immunization status , as well as social and family history   See HPI Labs reviewed and ordered Cologuard ordered (did not go through last time/tech issue) Open to colonoscopy if this was pos Mammogram due in June- she will call to schedule  Pap utd 11/2021 - nl  Has good support for grief  Enc wt los with lower glycemic diet

## 2022-12-31 NOTE — Assessment & Plan Note (Signed)
Watching B12 and renal fxn Lab Results  Component Value Date   VITAMINB12 602 12/24/2022

## 2023-01-25 LAB — COLOGUARD: COLOGUARD: NEGATIVE

## 2023-01-27 ENCOUNTER — Encounter: Payer: Self-pay | Admitting: *Deleted

## 2023-02-06 ENCOUNTER — Encounter: Payer: Self-pay | Admitting: Family Medicine

## 2023-02-06 ENCOUNTER — Telehealth (INDEPENDENT_AMBULATORY_CARE_PROVIDER_SITE_OTHER): Payer: Managed Care, Other (non HMO) | Admitting: Family Medicine

## 2023-02-06 DIAGNOSIS — L237 Allergic contact dermatitis due to plants, except food: Secondary | ICD-10-CM

## 2023-02-06 MED ORDER — PREDNISONE 20 MG PO TABS: ORAL_TABLET | ORAL | 0 refills | Status: DC

## 2023-02-06 NOTE — Assessment & Plan Note (Signed)
Acute, no red flags or need for urgent evaluation. Likely allergic contact dermatitis to poison ivy.  Will treat with prednisone taper and topical over-the-counter cortisone cream.  Return and ER precautions provided.

## 2023-02-06 NOTE — Progress Notes (Signed)
VIRTUAL VISIT A virtual visit is felt to be most appropriate for this patient at this time.   I connected with the patient on 02/06/23 at  9:40 AM EDT by virtual telehealth platform and verified that I am speaking with the correct person using two identifiers.   I discussed the limitations, risks, security and privacy concerns of performing an evaluation and management service by  virtual telehealth platform and the availability of in person appointments. I also discussed with the patient that there may be a patient responsible charge related to this service. The patient expressed understanding and agreed to proceed.  Patient location: Home Provider Location: [facility] Participants: Kerby Nora and Ileene Hutchinson   Chief Complaint  Patient presents with   Poison Ivy    Showed up Wednesday both legs, left wrist, and chest going up to neck    History of Present Illness:  62 y.o. female patient of [facility], Raven Gallus, MD presents with new onset rash.  She reports new onset rash in the last 2 days following exposure to poison ivy working in the yard outside. Rash appeared on  started on  chest and arms.. now spreading  to leg and the neck.  Rash is red with blisters and very itchy.  She has been treating with  OTC cortisone cream.  No fever, no flulike symptoms, no shortness of breath or oral swelling.    COVID 19 screen No recent travel or known exposure to COVID19 The patient denies respiratory symptoms of COVID 19 at this time.  The importance of social distancing was discussed today.   Review of Systems  Constitutional:  Negative for chills and fever.  HENT:  Negative for ear pain.   Respiratory:  Negative for cough, shortness of breath and wheezing.   Cardiovascular:  Negative for chest pain and palpitations.  All other systems reviewed and are negative.     Past Medical History:  Diagnosis Date   DDD (degenerative disc disease)    Fatty liver    GERD  (gastroesophageal reflux disease)    Obesity     reports that she has never smoked. She has never used smokeless tobacco. She reports that she does not drink alcohol and does not use drugs.   Current Outpatient Medications:    Alum Hydroxide-Mag Carbonate (GAVISCON PO), Take by mouth daily as needed., Disp: , Rfl:    Ascorbic Acid (VITAMIN C) 1000 MG tablet, Take 1,000 mg by mouth daily., Disp: , Rfl:    ibuprofen (ADVIL) 400 MG tablet, Take 400 mg by mouth every 8 (eight) hours as needed., Disp: , Rfl:    Multiple Vitamins-Minerals (MULTIVITAL PO), Take 1 tablet by mouth 2 (two) times daily at 10 AM and 5 PM., Disp: , Rfl:    Naproxen Sodium (ALEVE PO), Take by mouth as needed., Disp: , Rfl:    nystatin-triamcinolone ointment (MYCOLOG), Apply 1 application topically 2 (two) times daily as needed. rash, Disp: 60 g, Rfl: 1   Omega-3 Fatty Acids (OMEGA-3 FISH OIL PO), Take 1 capsule by mouth daily., Disp: , Rfl:    omeprazole (PRILOSEC) 20 MG capsule, Take 20 mg by mouth daily., Disp: , Rfl:    predniSONE (DELTASONE) 20 MG tablet, 3 tabs by mouth daily x 3 days, then 2 tabs by mouth daily x 2 days then 1 tab by mouth daily x 2 days, Disp: 15 tablet, Rfl: 0   triamcinolone (KENALOG) 0.1 %, Apply 1 application topically 2 (two) times daily  as needed. On rash on hand, Disp: 30 g, Rfl: 1   TURMERIC PO, Take 1 capsule by mouth daily. 538mg , Disp: , Rfl:    Observations/Objective: Last menstrual period 02/19/2012.  Physical Exam Constitutional:      General: The patient is not in acute distress. Pulmonary:     Effort: Pulmonary effort is normal. No respiratory distress.  Neurological:     Mental Status: The patient is alert and oriented to person, place, and time.  Psychiatric:        Mood and Affect: Mood normal.        Behavior: Behavior normal.  Skin: Redness seen on rash but details of rash not able to be appreciated virtually  Assessment and Plan Plant allergic contact  dermatitis Assessment & Plan: Acute, no red flags or need for urgent evaluation. Likely allergic contact dermatitis to poison ivy.  Will treat with prednisone taper and topical over-the-counter cortisone cream.  Return and ER precautions provided.   Other orders -     predniSONE; 3 tabs by mouth daily x 3 days, then 2 tabs by mouth daily x 2 days then 1 tab by mouth daily x 2 days  Dispense: 15 tablet; Refill: 0      I discussed the assessment and treatment plan with the patient. The patient was provided an opportunity to ask questions and all were answered. The patient agreed with the plan and demonstrated an understanding of the instructions.   The patient was advised to call back or seek an in-person evaluation if the symptoms worsen or if the condition fails to improve as anticipated.     Kerby Nora, MD

## 2023-02-11 ENCOUNTER — Encounter: Payer: Self-pay | Admitting: Family Medicine

## 2023-02-13 ENCOUNTER — Other Ambulatory Visit: Payer: Self-pay | Admitting: Family Medicine

## 2023-02-13 MED ORDER — PREDNISONE 20 MG PO TABS: ORAL_TABLET | ORAL | 0 refills | Status: DC

## 2023-02-13 MED ORDER — TRIAMCINOLONE ACETONIDE 0.5 % EX CREA: 1.0000 | TOPICAL_CREAM | Freq: Two times a day (BID) | CUTANEOUS | 0 refills | Status: AC

## 2023-05-11 ENCOUNTER — Encounter: Payer: Self-pay | Admitting: Family Medicine

## 2023-05-15 MED ORDER — NYSTATIN-TRIAMCINOLONE 100000-0.1 UNIT/GM-% EX OINT: 1.0000 | TOPICAL_OINTMENT | Freq: Two times a day (BID) | CUTANEOUS | 1 refills | Status: AC | PRN

## 2023-07-01 ENCOUNTER — Other Ambulatory Visit: Payer: Self-pay | Admitting: Family Medicine

## 2023-07-01 DIAGNOSIS — Z0000 Encounter for general adult medical examination without abnormal findings: Secondary | ICD-10-CM

## 2023-07-27 ENCOUNTER — Ambulatory Visit: Payer: Managed Care, Other (non HMO)

## 2023-07-30 ENCOUNTER — Ambulatory Visit
Admission: RE | Admit: 2023-07-30 | Discharge: 2023-07-30 | Disposition: A | Discharge status: Home / Self Care | Payer: Managed Care, Other (non HMO) | Source: Ambulatory Visit | Attending: Family Medicine | Admitting: Family Medicine

## 2023-07-30 DIAGNOSIS — Z0000 Encounter for general adult medical examination without abnormal findings: Secondary | ICD-10-CM

## 2024-01-28 IMAGING — MG MM DIGITAL SCREENING BILAT W/ TOMO AND CAD
8 series · 8 of 24 positions shown · non-contrast
Comparison: Previous exam(s).

ACR Breast Density Category a: The breast tissue is almost entirely
fatty.

CLINICAL DATA: Screening.

EXAM:
DIGITAL SCREENING BILATERAL MAMMOGRAM WITH TOMOSYNTHESIS AND CAD
TECHNIQUE: Bilateral screening digital craniocaudal and mediolateral oblique
mammograms were obtained. Bilateral screening digital breast
tomosynthesis was performed. The images were evaluated with
computer-aided detection.

[L CC synth-2D]
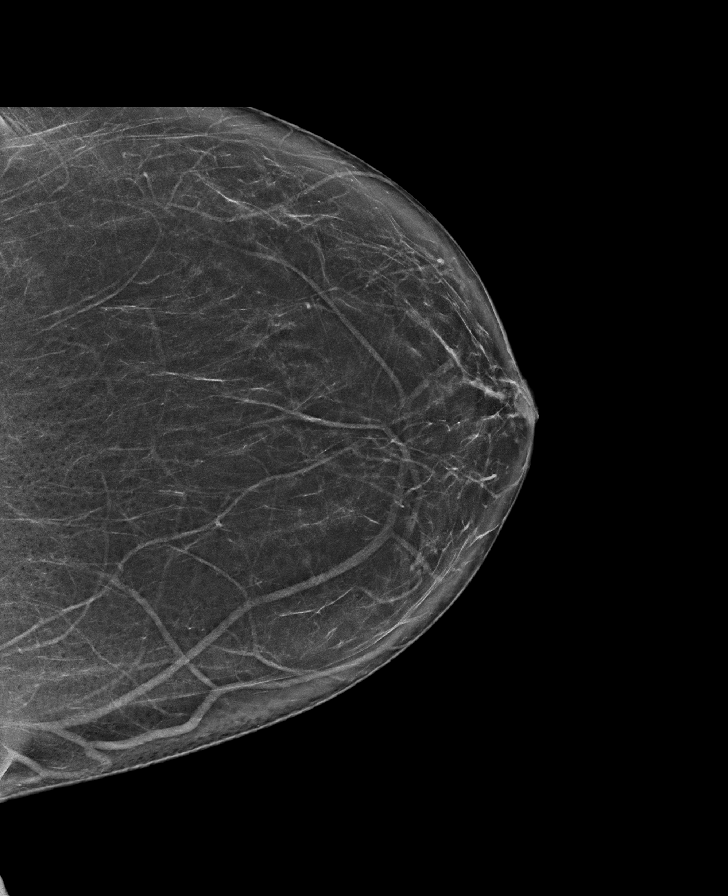

[R MLO synth-2D]
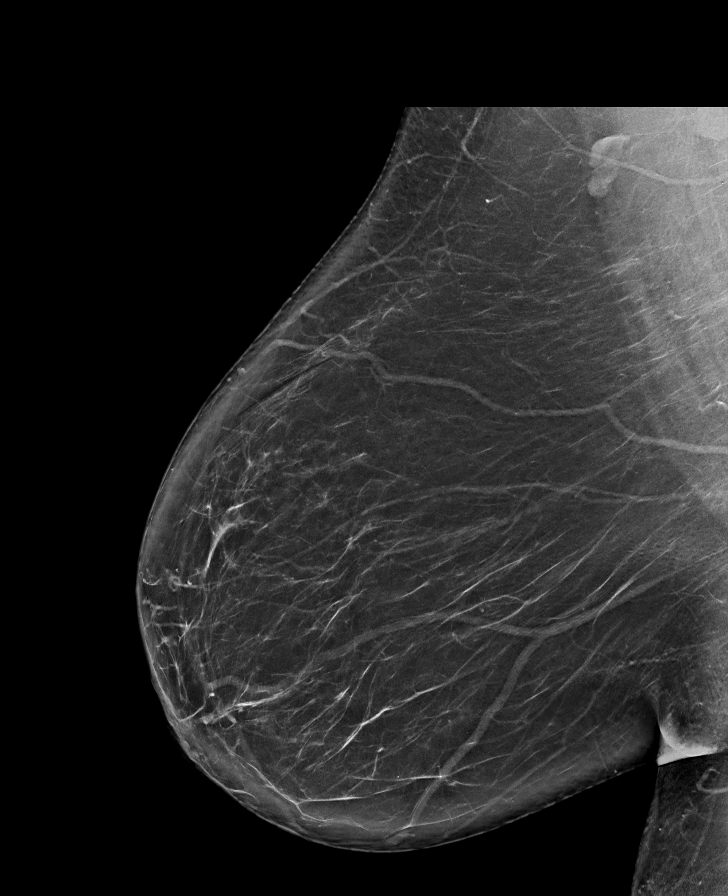

[L MLO synth-2D]
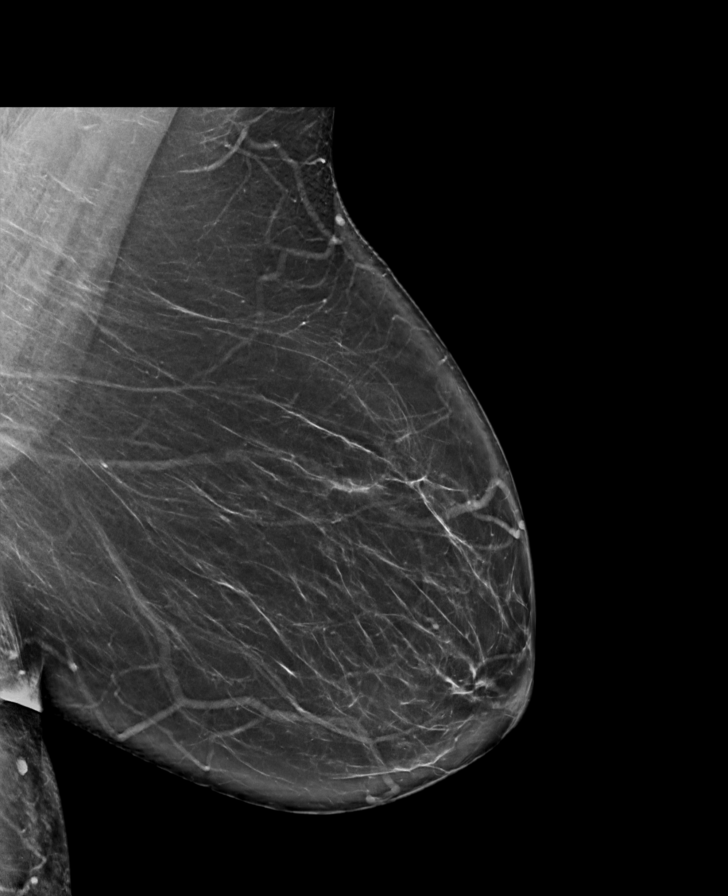

[R CC synth-2D]
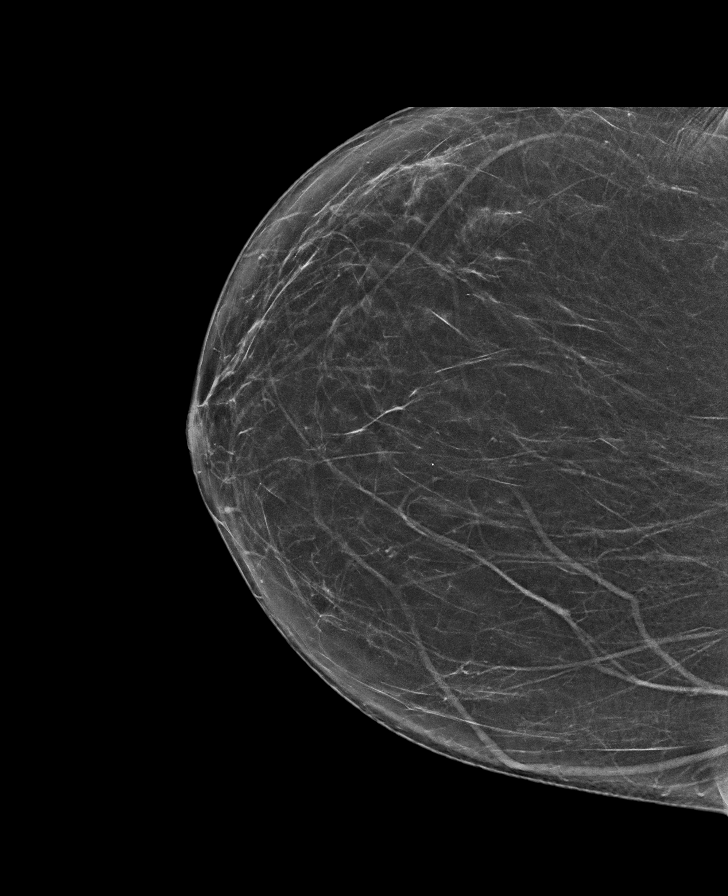

[R CC tomo · tomo slice 37/72.0]
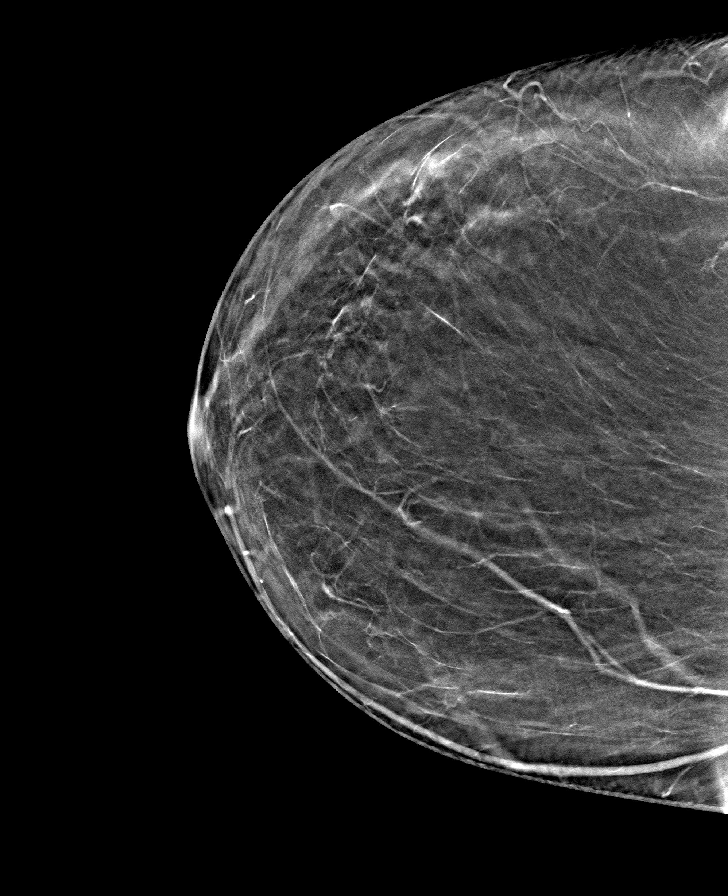

[L CC tomo · tomo slice 38/75.0]
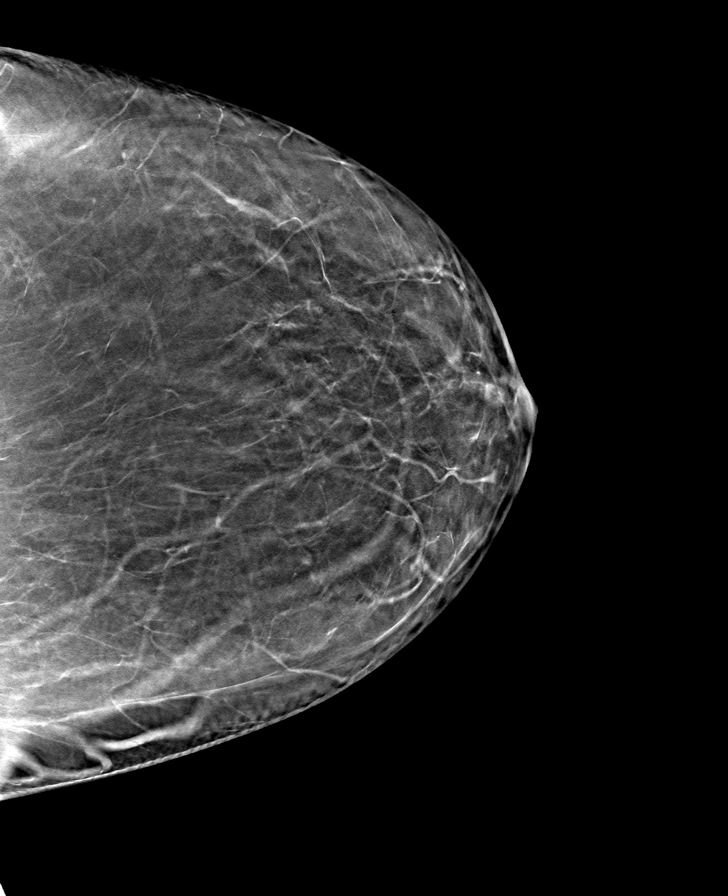

[L MLO tomo · tomo slice 47/92.0]
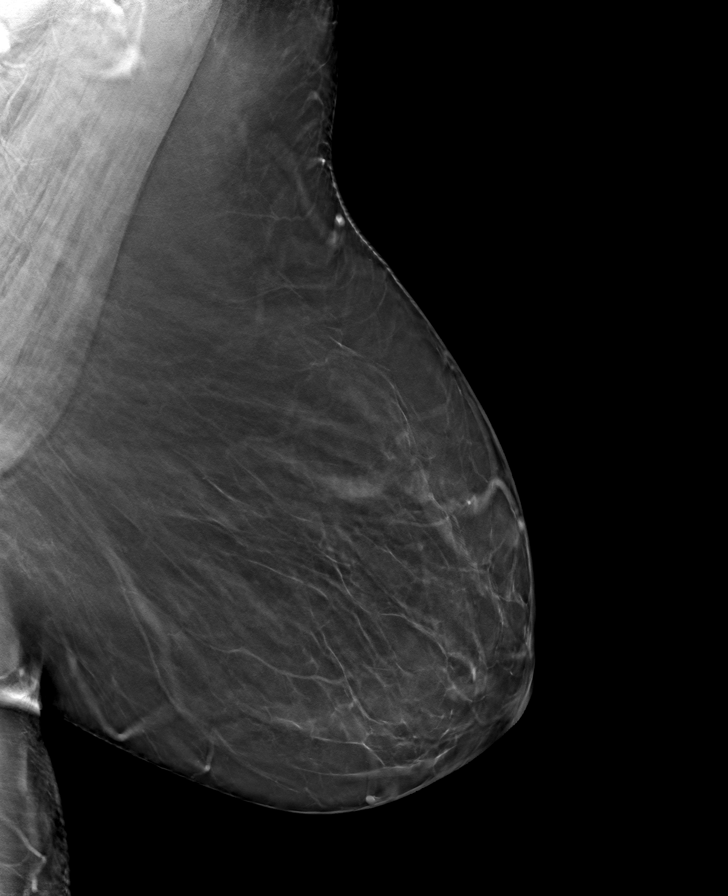

[R MLO tomo · tomo slice 47/93.0]
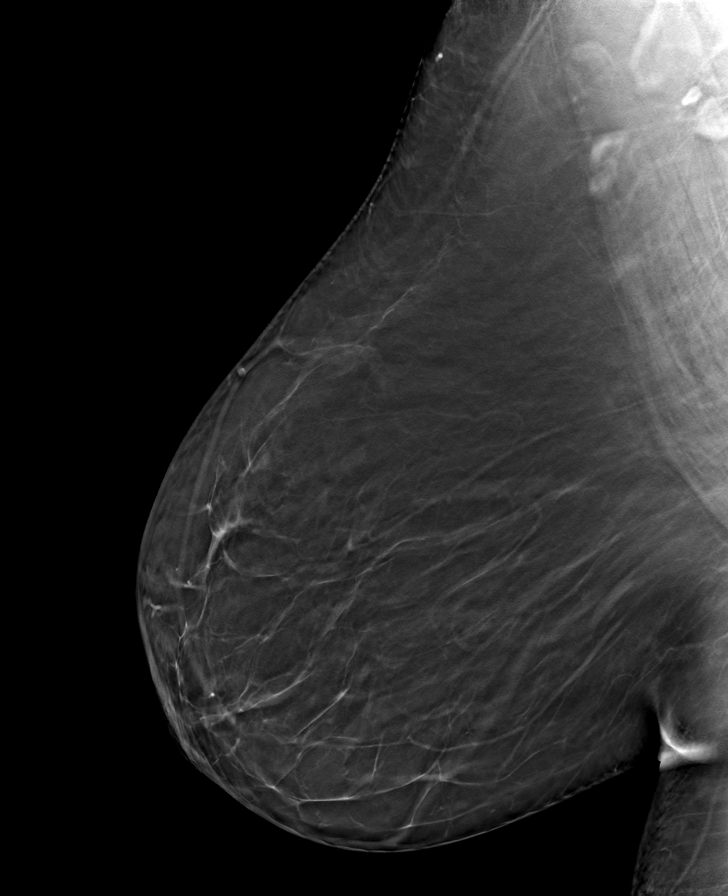

[8 of 24 positions shown; findings below may reference images not displayed]

FINDINGS: There are no findings suspicious for malignancy.
IMPRESSION: No mammographic evidence of malignancy. A result letter of this
screening mammogram will be mailed directly to the patient.

RECOMMENDATION:
Screening mammogram in one year. (Code:0E-3-N98)

BI-RADS CATEGORY  1: Negative.

## 2024-03-25 ENCOUNTER — Encounter: Payer: Self-pay | Admitting: Family Medicine

## 2024-03-25 ENCOUNTER — Ambulatory Visit (INDEPENDENT_AMBULATORY_CARE_PROVIDER_SITE_OTHER): Admitting: Family Medicine

## 2024-03-25 VITALS — BP 135/88 | HR 52 | Temp 98.1°F | Ht <= 58 in | Wt 247.1 lb

## 2024-03-25 DIAGNOSIS — Z1211 Encounter for screening for malignant neoplasm of colon: Secondary | ICD-10-CM

## 2024-03-25 DIAGNOSIS — Z0000 Encounter for general adult medical examination without abnormal findings: Secondary | ICD-10-CM

## 2024-03-25 DIAGNOSIS — K219 Gastro-esophageal reflux disease without esophagitis: Secondary | ICD-10-CM | POA: Diagnosis not present

## 2024-03-25 DIAGNOSIS — Z114 Encounter for screening for human immunodeficiency virus [HIV]: Secondary | ICD-10-CM

## 2024-03-25 DIAGNOSIS — Z79899 Other long term (current) drug therapy: Secondary | ICD-10-CM

## 2024-03-25 DIAGNOSIS — Z1159 Encounter for screening for other viral diseases: Secondary | ICD-10-CM

## 2024-03-25 DIAGNOSIS — R7309 Other abnormal glucose: Secondary | ICD-10-CM | POA: Diagnosis not present

## 2024-03-25 NOTE — Patient Instructions (Addendum)
 Your mammogram will be due in November   Add some strength training to your routine, this is important for bone and brain health and can reduce your risk of falls and help your body use insulin properly and regulate weight  Light weights, exercise bands , and internet videos are a good way to start  Yoga (chair or regular), machines , floor exercises or a gym with machines are also good options    Try to get most of your carbohydrates from produce (with the exception of white potatoes) and whole grains Eat less bread/pasta/rice/snack foods/cereals/sweets and other items from the middle of the grocery store (processed carbs)  Labs today    Remember to wear sun protection along with insect protection   Take care of yourself   Your blood pressure is borderline  We will keep an eye on this

## 2024-03-25 NOTE — Assessment & Plan Note (Signed)
Hep c screen today

## 2024-03-25 NOTE — Assessment & Plan Note (Signed)
 Discussed how this problem influences overall health and the risks it imposes  Reviewed plan for weight loss with lower calorie diet (via better food choices (lower glycemic and portion control) along with exercise building up to or more than 30 minutes 5 days per week including some aerobic activity and strength training

## 2024-03-25 NOTE — Assessment & Plan Note (Signed)
HIV screening today 

## 2024-03-25 NOTE — Progress Notes (Signed)
 Subjective:    Patient ID: Raven Gibson, female    DOB: [birth year], 63 y.o.   MRN: [id number]  HPI  Here for health maintenance exam and to review chronic medical problems   Wt Readings from Last 3 Encounters:  03/25/24 247 lb 2 oz (112.1 kg)  12/31/22 234 lb 4 oz (106.3 kg)  11/26/21 237 lb 6.4 oz (107.7 kg)   51.65 kg/m  Vitals:   03/25/24 1422 03/25/24 1452  BP: (!) 146/94 135/88  Pulse: (!) 52   Temp: 98.1 F (36.7 C)   SpO2: 97%     Immunization History  Administered Date(s) Administered   Influenza Whole 09/04/2006   Influenza, Mdck, Trivalent,PF 6+ MOS(egg free) 05/23/2023   Influenza,inj,Quad PF,6+ Mos 08/26/2012, 08/31/2013, 07/10/2014, 07/27/2015, 07/11/2016, 07/03/2017, 07/19/2018, 06/08/2021   Influenza-Unspecified 06/22/2020   Janssen (J&J) SARS-COV-2 Vaccination 11/28/2019   Pfizer(Comirnaty)Fall Seasonal Vaccine 12 years and older 05/23/2023   Pneumococcal Polysaccharide-23 09/04/2006, 04/13/2013   Td 07/25/1999, 09/15/2008   Tdap 11/26/2021    There are no preventive care reminders to display for this patient.  Feeling ok  Taking care of herself  Arthritis is worse Working a lot    Hep C / HIV screen -interested Low risk    Shingrix vaccine Not interested   Mammogram 07/2023  Self breast exam- no lumps    Gyn health No problems  Pap normal 11/2021   Colon cancer screening -cologuard 01/2023 negative   Bone health   Falls-none [place]  Supplements -vitamin D  with K  Last vitamin D  Lab Results  Component Value Date   VD25OH 69 03/25/2024    Exercise :  Walking at work  [employer] dog out and in  Does lift the dog 80 lb     Mood    03/25/2024    2:37 PM 02/06/2023    9:29 AM 11/26/2021   10:19 AM 09/04/2020   11:51 AM 07/03/2017   12:43 PM  Depression screen PHQ 2/9  Decreased Interest 0 0 0 0 0  Down, Depressed, Hopeless 0 0 0 0 0  PHQ - 2 Score 0 0 0 0 0  Altered sleeping 1   0   Tired, decreased energy 0   1    Change in appetite 0   1   Feeling bad or failure about yourself  0   0   Trouble concentrating 0   0   Moving slowly or fidgety/restless 0   0   Suicidal thoughts 0   0   PHQ-9 Score 1   2   Difficult doing work/chores Not difficult at all       History of elevated glucose Lab Results  Component Value Date   HGBA1C 5.5 03/25/2024   HGBA1C 5.6 12/24/2022   HGBA1C 5.6 11/26/2021   Due for labs   Eats regular meals  One snack if she gets up early  Food choices are fair  It is hard with her schedule   Some fruit in yogurt  Salad for veggies   Not a sweet eater most of the time  More simple carbs than she should     GERD Omeprazole 20 mg daily  Lab Results  Component Value Date   VITAMINB12 712 03/25/2024     Patient Active Problem List   Diagnosis Date Noted   Encounter for hepatitis C screening test for low risk patient 03/25/2024   Encounter for screening for HIV 03/25/2024   Visit for routine gyn exam 11/26/2021  Current use of proton pump inhibitor 09/04/2020   Eczema of right hand 09/04/2020   Encounter for screening mammogram for breast cancer 07/03/2017   Intertrigo 07/03/2017   Elevated glucose level 07/03/2017   Severe obesity (BMI >= 40) (HCC) 04/14/2013   Colon cancer screening 04/13/2013   Routine general medical examination at a health care facility 01/13/2012   GERD 09/14/2007   Osteoarthritis 09/14/2007   DEGENERATIVE DISC DISEASE 09/14/2007   Past Medical History:  Diagnosis Date   Allergy    codene   Arthritis    DDD (degenerative disc disease)    Fatty liver    GERD (gastroesophageal reflux disease)    Heart murmur    Obesity    Past Surgical History:  Procedure Laterality Date   CHOLECYSTECTOMY     LUMBAR SPINE SURGERY     ruptured disk   SPINE SURGERY     Social History   Tobacco Use   Smoking status: Never   Smokeless tobacco: Never  Vaping Use   Vaping status: Never Used  Substance Use Topics   Alcohol use: No    Drug use: No   Family History  Problem Relation Age of Onset   Hypertension Mother    Hyperlipidemia Mother    Arthritis Mother    Kidney disease Mother    Varicose Veins Mother    Hypertension Father        MI x 3   Cancer Father    Stroke Father    Vision loss Father    Allergies  Allergen Reactions   Codeine     REACTION: nausea and vomiting   Current Outpatient Medications on File Prior to Visit  Medication Sig Dispense Refill   Alum Hydroxide-Mag Carbonate (GAVISCON PO) Take by mouth daily as needed.     Ascorbic Acid (VITAMIN C) 1000 MG tablet Take 1,000 mg by mouth daily.     cholecalciferol (VITAMIN D3) 25 MCG (1000 UNIT) tablet Take 1,000 Units by mouth daily.     ibuprofen (ADVIL) 400 MG tablet Take 400 mg by mouth every 8 (eight) hours as needed.     Multiple Vitamins-Minerals (MULTIVITAL PO) Take 1 tablet by mouth 2 (two) times daily at 10 AM and 5 PM.     Naproxen Sodium (ALEVE PO) Take by mouth as needed.     nystatin -triamcinolone  ointment (MYCOLOG) Apply 1 Application topically 2 (two) times daily as needed. rash 60 g 1   Omega-3 Fatty Acids (OMEGA-3 FISH OIL PO) Take 1 capsule by mouth daily.     omeprazole (PRILOSEC) 20 MG capsule Take 20 mg by mouth daily.     triamcinolone  cream (KENALOG ) 0.5 % Apply 1 Application topically 2 (two) times daily. 30 g 0   TURMERIC PO Take 1 capsule by mouth daily. 538mg      No current facility-administered medications on file prior to visit.    Review of Systems  Constitutional:  Negative for activity change, appetite change, fatigue, fever and unexpected weight change.  HENT:  Negative for congestion, ear pain, rhinorrhea, sinus pressure and sore throat.   Eyes:  Negative for pain, redness and visual disturbance.  Respiratory:  Negative for cough, shortness of breath and wheezing.   Cardiovascular:  Negative for chest pain and palpitations.  Gastrointestinal:  Negative for abdominal pain, blood in stool, constipation and  diarrhea.  Endocrine: Negative for polydipsia and polyuria.  Genitourinary:  Negative for dysuria, frequency and urgency.  Musculoskeletal:  Negative for arthralgias, back pain and myalgias.  Some aches and pains   Skin:  Negative for pallor and rash.  Allergic/Immunologic: Negative for environmental allergies.  Neurological:  Negative for dizziness, syncope and headaches.  Hematological:  Negative for adenopathy. Does not bruise/bleed easily.  Psychiatric/Behavioral:  Negative for decreased concentration and dysphoric mood. The patient is not nervous/anxious.        Objective:   Physical Exam Constitutional:      General: She is not in acute distress.    Appearance: Normal appearance. She is well-developed. She is obese. She is not ill-appearing or diaphoretic.  HENT:     Head: Normocephalic and atraumatic.     Right Ear: Tympanic membrane, ear canal and external ear normal.     Left Ear: Tympanic membrane, ear canal and external ear normal.     Nose: Nose normal. No congestion.     Mouth/Throat:     Mouth: Mucous membranes are moist.     Pharynx: Oropharynx is clear. No posterior oropharyngeal erythema.  Eyes:     General: No scleral icterus.    Extraocular Movements: Extraocular movements intact.     Conjunctiva/sclera: Conjunctivae normal.     Pupils: Pupils are equal, round, and reactive to light.  Neck:     Thyroid : No thyromegaly.     Vascular: No carotid bruit or JVD.  Cardiovascular:     Rate and Rhythm: Normal rate and regular rhythm.     Pulses: Normal pulses.     Heart sounds: Normal heart sounds.     No gallop.  Pulmonary:     Effort: Pulmonary effort is normal. No respiratory distress.     Breath sounds: Normal breath sounds. No wheezing.     Comments: Good air exch Chest:     Chest wall: No tenderness.  Abdominal:     General: Bowel sounds are normal. There is no distension or abdominal bruit.     Palpations: Abdomen is soft. There is no mass.      Tenderness: There is no abdominal tenderness.     Hernia: No hernia is present.  Genitourinary:    Comments: Breast exam: No mass, nodules, thickening, tenderness, bulging, retraction, inflamation, nipple discharge or skin changes noted.  No axillary or clavicular LA.     Musculoskeletal:        General: No tenderness. Normal range of motion.     Cervical back: Normal range of motion and neck supple. No rigidity. No muscular tenderness.     Right lower leg: No edema.     Left lower leg: No edema.     Comments: No kyphosis   Lymphadenopathy:     Cervical: No cervical adenopathy.  Skin:    General: Skin is warm and dry.     Coloration: Skin is not pale.     Findings: No erythema or rash.     Comments: Solar lentigines diffusely Insect bites on LEs -healing  Neurological:     Mental Status: She is alert. Mental status is at baseline.     Cranial Nerves: No cranial nerve deficit.     Motor: No abnormal muscle tone.     Coordination: Coordination normal.     Gait: Gait normal.     Deep Tendon Reflexes: Reflexes are normal and symmetric. Reflexes normal.  Psychiatric:        Mood and Affect: Mood normal.        Cognition and Memory: Cognition and memory normal.           Assessment &  Plan:   Problem List Items Addressed This Visit       Digestive   GERD   Continues omeprazole 20mg  daily which works well Encouraged to watch diet for triggers Weight loss would help also   Added B12 and D to labs today        Other   Severe obesity (BMI >= 40) (HCC)   Discussed how this problem influences overall health and the risks it imposes  Reviewed plan for weight loss with lower calorie diet (via better food choices (lower glycemic and portion control) along with exercise building up to or more than 30 minutes 5 days per week including some aerobic activity and strength training         Routine general medical examination at a health care facility - Primary   Reviewed health  habits including diet and exercise and skin cancer prevention Reviewed appropriate screening tests for age  Also reviewed health mt list, fam hx and immunization status , as well as social and family history   See HPI Labs reviewed and ordered Health Maintenance  Topic Date Due   HIV Screening  Never done   Hepatitis C Screening  Never done   Zoster (Shingles) Vaccine (1 of 2) 06/25/2025*   COVID-19 Vaccine (3 - 2024-25 season) 04/10/2026*   Flu Shot  04/08/2024   Mammogram  07/29/2025   Cologuard (Stool DNA test)  01/17/2026   Pap with HPV screening  11/27/2026   DTaP/Tdap/Td vaccine (4 - Td or Tdap) 11/27/2031   Pneumococcal Vaccination  Aged Out   Hepatitis B Vaccine  Aged Out   HPV Vaccine  Aged Out   Meningitis B Vaccine  Aged Out  *Topic was postponed. The date shown is not the original due date.     Hep c/ HIV screen today   low risk Declines shingrix vaccine Mammo due in nov Discussed fall prevention, supplements and exercise for bone density  Encouraged more strength building exercise PHQ 1   Lab today      Relevant Orders   TSH (Completed)   Lipid Panel (Completed)   Comprehensive metabolic panel with GFR (Completed)   CBC with Differential/Platelet (Completed)   Encounter for screening for HIV   HIV screening today      Relevant Orders   HIV Antibody (routine testing w rflx) (Completed)   Encounter for hepatitis C screening test for low risk patient   Hep c screen today      Relevant Orders   Hepatitis C Antibody (Completed)   Elevated glucose level   A1c ordered   disc imp of low glycemic diet and wt loss to prevent DM2       Relevant Orders   Hemoglobin A1c (Completed)   Current use of proton pump inhibitor   B12 and D levels added to labs       Relevant Orders   VITAMIN D  25 Hydroxy (Vit-D Deficiency, Fractures) (Completed)   Vitamin B12 (Completed)   Colon cancer screening   Cologuard negative 01/2023- this is good for 2 more years

## 2024-03-25 NOTE — Assessment & Plan Note (Signed)
 Reviewed health habits including diet and exercise and skin cancer prevention Reviewed appropriate screening tests for age  Also reviewed health mt list, fam hx and immunization status , as well as social and family history   See HPI Labs reviewed and ordered Health Maintenance  Topic Date Due   HIV Screening  Never done   Hepatitis C Screening  Never done   Zoster (Shingles) Vaccine (1 of 2) 06/25/2025*   COVID-19 Vaccine (3 - 2024-25 season) 04/10/2026*   Flu Shot  04/08/2024   Mammogram  07/29/2025   Cologuard (Stool DNA test)  01/17/2026   Pap with HPV screening  11/27/2026   DTaP/Tdap/Td vaccine (4 - Td or Tdap) 11/27/2031   Pneumococcal Vaccination  Aged Out   Hepatitis B Vaccine  Aged Out   HPV Vaccine  Aged Out   Meningitis B Vaccine  Aged Out  *Topic was postponed. The date shown is not the original due date.     Hep c/ HIV screen today   low risk Declines shingrix vaccine Mammo due in nov Discussed fall prevention, supplements and exercise for bone density  Encouraged more strength building exercise PHQ 1   Lab today

## 2024-03-25 NOTE — Assessment & Plan Note (Signed)
 A1c ordered  disc imp of low glycemic diet and wt loss to prevent DM2

## 2024-03-25 NOTE — Assessment & Plan Note (Signed)
 Continues omeprazole 20mg  daily which works well Encouraged to watch diet for triggers Weight loss would help also   Added B12 and D to labs today

## 2024-03-25 NOTE — Assessment & Plan Note (Signed)
 Cologuard negative 01/2023- this is good for 2 more years

## 2024-03-25 NOTE — Assessment & Plan Note (Signed)
B12 and D levels added to labs

## 2024-03-26 LAB — CBC WITH DIFFERENTIAL/PLATELET
Absolute Lymphocytes: 1996 {cells}/uL (ref 850–3900)
Absolute Monocytes: 410 {cells}/uL (ref 200–950)
Basophils Absolute: 72 {cells}/uL (ref 0–200)
Basophils Relative: 1.1 %
Eosinophils Absolute: 150 {cells}/uL (ref 15–500)
Eosinophils Relative: 2.3 %
HCT: 43.2 % (ref 35.0–45.0)
Hemoglobin: 14 g/dL (ref 11.7–15.5)
MCH: 31.5 pg (ref 27.0–33.0)
MCHC: 32.4 g/dL (ref 32.0–36.0)
MCV: 97.1 fL (ref 80.0–100.0)
MPV: 10.8 fL (ref 7.5–12.5)
Monocytes Relative: 6.3 %
Neutro Abs: 3874 {cells}/uL (ref 1500–7800)
Neutrophils Relative %: 59.6 %
Platelets: 291 Thousand/uL (ref 140–400)
RBC: 4.45 Million/uL (ref 3.80–5.10)
RDW: 12.5 % (ref 11.0–15.0)
Total Lymphocyte: 30.7 %
WBC: 6.5 Thousand/uL (ref 3.8–10.8)

## 2024-03-26 LAB — VITAMIN D 25 HYDROXY (VIT D DEFICIENCY, FRACTURES): Vit D, 25-Hydroxy: 69 ng/mL (ref 30–100)

## 2024-03-26 LAB — COMPREHENSIVE METABOLIC PANEL WITH GFR
AG Ratio: 1.5 (calc) (ref 1.0–2.5)
ALT: 16 U/L (ref 6–29)
AST: 16 U/L (ref 10–35)
Albumin: 4.2 g/dL (ref 3.6–5.1)
Alkaline phosphatase (APISO): 78 U/L (ref 37–153)
BUN: 13 mg/dL (ref 7–25)
CO2: 28 mmol/L (ref 20–32)
Calcium: 9.3 mg/dL (ref 8.6–10.4)
Chloride: 101 mmol/L (ref 98–110)
Creat: 0.91 mg/dL (ref 0.50–1.05)
Globulin: 2.8 g/dL (ref 1.9–3.7)
Glucose, Bld: 87 mg/dL (ref 65–99)
Potassium: 3.8 mmol/L (ref 3.5–5.3)
Sodium: 139 mmol/L (ref 135–146)
Total Bilirubin: 0.5 mg/dL (ref 0.2–1.2)
Total Protein: 7 g/dL (ref 6.1–8.1)
eGFR: 71 mL/min/1.73m2 (ref >=60)

## 2024-03-26 LAB — HEMOGLOBIN A1C
Hgb A1c MFr Bld: 5.5 % (ref <5.7)
Mean Plasma Glucose: 111 mg/dL
eAG (mmol/L): 6.2 mmol/L

## 2024-03-26 LAB — LIPID PANEL
Cholesterol: 242 mg/dL — ABNORMAL HIGH (ref <200)
HDL: 79 mg/dL (ref >=50)
LDL Cholesterol (Calc): 138 mg/dL — ABNORMAL HIGH
Non-HDL Cholesterol (Calc): 163 mg/dL — ABNORMAL HIGH (ref <130)
Total CHOL/HDL Ratio: 3.1 (calc) (ref <5.0)
Triglycerides: 123 mg/dL (ref <150)

## 2024-03-26 LAB — HEPATITIS C ANTIBODY: Hepatitis C Ab: NONREACTIVE

## 2024-03-26 LAB — HIV ANTIBODY (ROUTINE TESTING W REFLEX): HIV 1&2 Ab, 4th Generation: NONREACTIVE

## 2024-03-26 LAB — VITAMIN B12: Vitamin B-12: 712 pg/mL (ref 200–1100)

## 2024-03-26 LAB — TSH: TSH: 1.73 m[IU]/L (ref 0.40–4.50)

## 2024-03-27 ENCOUNTER — Ambulatory Visit: Payer: Self-pay | Admitting: Family Medicine

## 2024-08-01 ENCOUNTER — Other Ambulatory Visit: Payer: Self-pay | Admitting: Family Medicine

## 2024-08-01 DIAGNOSIS — Z1231 Encounter for screening mammogram for malignant neoplasm of breast: Secondary | ICD-10-CM

## 2024-08-02 ENCOUNTER — Ambulatory Visit
Admission: RE | Admit: 2024-08-02 | Discharge: 2024-08-02 | Disposition: A | Discharge status: Home / Self Care | Source: Ambulatory Visit | Attending: Family Medicine | Admitting: Family Medicine

## 2024-08-02 DIAGNOSIS — Z1231 Encounter for screening mammogram for malignant neoplasm of breast: Secondary | ICD-10-CM

## 2024-08-08 ENCOUNTER — Ambulatory Visit: Payer: Self-pay | Admitting: Family Medicine
